# Patient Record
Sex: Female | Born: 1990 | Race: Black or African American | Hispanic: No | Marital: Married | Smoking: Current every day smoker
Health system: Southern US, Community
[De-identification: ages and names within clinical notes are randomized; demographics above are authoritative.]

## PROBLEM LIST (undated history)

## (undated) ENCOUNTER — Inpatient Hospital Stay (HOSPITAL_COMMUNITY): Payer: Self-pay

## (undated) DIAGNOSIS — Z202 Contact with and (suspected) exposure to infections with a predominantly sexual mode of transmission: Secondary | ICD-10-CM

## (undated) DIAGNOSIS — Z789 Other specified health status: Secondary | ICD-10-CM

## (undated) DIAGNOSIS — D1803 Hemangioma of intra-abdominal structures: Secondary | ICD-10-CM

## (undated) HISTORY — DX: Morbid (severe) obesity due to excess calories: E66.01

## (undated) HISTORY — PX: NO PAST SURGERIES: SHX2092

---
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
## 2009-02-11 ENCOUNTER — Emergency Department (HOSPITAL_COMMUNITY): Admission: EM | Admit: 2009-02-11 | Discharge: 2009-02-11 | Payer: Self-pay | Admitting: Emergency Medicine

## 2010-02-20 ENCOUNTER — Encounter: Payer: Self-pay | Admitting: *Deleted

## 2011-12-22 ENCOUNTER — Encounter (HOSPITAL_COMMUNITY): Payer: Self-pay | Admitting: Emergency Medicine

## 2011-12-22 ENCOUNTER — Other Ambulatory Visit (HOSPITAL_COMMUNITY)
Admission: RE | Admit: 2011-12-22 | Discharge: 2011-12-22 | Disposition: A | Discharge status: Home / Self Care | Payer: Self-pay | Source: Ambulatory Visit | Attending: Emergency Medicine | Admitting: Emergency Medicine

## 2011-12-22 ENCOUNTER — Emergency Department (INDEPENDENT_AMBULATORY_CARE_PROVIDER_SITE_OTHER)
Admission: EM | Admit: 2011-12-22 | Discharge: 2011-12-22 | Disposition: A | Discharge status: Home / Self Care | Payer: Self-pay | Source: Home / Self Care | Attending: Emergency Medicine | Admitting: Emergency Medicine

## 2011-12-22 DIAGNOSIS — N760 Acute vaginitis: Secondary | ICD-10-CM | POA: Insufficient documentation

## 2011-12-22 DIAGNOSIS — M549 Dorsalgia, unspecified: Secondary | ICD-10-CM

## 2011-12-22 DIAGNOSIS — Z113 Encounter for screening for infections with a predominantly sexual mode of transmission: Secondary | ICD-10-CM | POA: Insufficient documentation

## 2011-12-22 DIAGNOSIS — N730 Acute parametritis and pelvic cellulitis: Secondary | ICD-10-CM

## 2011-12-22 DIAGNOSIS — N898 Other specified noninflammatory disorders of vagina: Secondary | ICD-10-CM

## 2011-12-22 LAB — POCT URINALYSIS DIP (DEVICE)
Ketones, ur: NEGATIVE mg/dL
Protein, ur: 30 mg/dL — AB
Specific Gravity, Urine: 1.02 (ref 1.005–1.030)
Urobilinogen, UA: 0.2 mg/dL (ref 0.0–1.0)
pH: 6.5 (ref 5.0–8.0)

## 2011-12-22 NOTE — ED Provider Notes (Addendum)
History     CSN: [id number]  Arrival date & time 12/22/11  1443   First MD Initiated Contact with Patient 12/22/11 1701      Chief Complaint  Patient presents with  . Abdominal Pain    (Consider location/radiation/quality/duration/timing/severity/associated sxs/prior treatment) Patient is a 21 y.o. female presenting with abdominal pain. The history is provided by the patient.  Abdominal Pain The primary symptoms of the illness include abdominal pain. The current episode started 2 days ago. The onset of the illness was gradual. The problem has not changed since onset. The abdominal pain is generalized. The abdominal pain radiates to the left flank and back. The severity of the abdominal pain is 8/10. Relieved by: ibuprofen.  The patient states that she believes she is currently not pregnant. The patient has not had a change in bowel habit. Additional symptoms associated with the illness include back pain. Symptoms associated with the illness do not include chills, anorexia, diaphoresis, heartburn, constipation, urgency, hematuria or frequency.  No LMP recorded. Patient has had an injection.   History reviewed. No pertinent past medical history.  History reviewed. No pertinent past surgical history.  No family history on file.  History  Substance Use Topics  . Smoking status: Never Smoker   . Smokeless tobacco: Not on file  . Alcohol Use: No    OB History    Grav Para Term Preterm Abortions TAB SAB Ect Mult Living                  Review of Systems  Constitutional: Negative for chills and diaphoresis.  Gastrointestinal: Positive for abdominal pain. Negative for heartburn, constipation and anorexia.  Genitourinary: Negative for urgency, frequency and hematuria.  Musculoskeletal: Positive for back pain.  All other systems reviewed and are negative.    Allergies  Peanuts  Home Medications   Current Outpatient Rx  Name  Route  Sig  Dispense  Refill  . IBUPROFEN  200 MG PO TABS   Oral   Take 200 mg by mouth every 6 (six) hours as needed.         Marland Kitchen DEPO-PROVERA IM   Intramuscular   Inject into the muscle. 3 weeks late having injection         . DOXYCYCLINE HYCLATE 100 MG PO CAPS   Oral   Take 1 capsule (100 mg total) by mouth 2 (two) times daily.   14 capsule   0   . METRONIDAZOLE 500 MG PO TABS   Oral   Take 1 tablet (500 mg total) by mouth 2 (two) times daily.   14 tablet   0     BP 109/77  Pulse 91  Temp 98.7 F (37.1 C) (Oral)  Resp 16  SpO2 95%  Physical Exam  Nursing note and vitals reviewed. Constitutional: She is oriented to person, place, and time. Vital signs are normal. She appears well-developed and well-nourished. She is active and cooperative.  HENT:  Head: Normocephalic.  Eyes: Conjunctivae normal are normal. Pupils are equal, round, and reactive to light. No scleral icterus.  Neck: Trachea normal and normal range of motion. Neck supple.  Cardiovascular: Normal rate, regular rhythm, normal heart sounds and intact distal pulses.   Pulmonary/Chest: Effort normal and breath sounds normal.  Abdominal: Soft. Bowel sounds are normal. There is tenderness in the suprapubic area. There is CVA tenderness.  Genitourinary: Uterus normal. Pelvic exam was performed with patient supine. There is no rash, tenderness, lesion or injury on the right  labia. There is no rash, tenderness, lesion or injury on the left labia. Cervix exhibits motion tenderness and discharge. Cervix exhibits no friability. Right adnexum displays no mass, no tenderness and no fullness. Left adnexum displays no mass, no tenderness and no fullness. No erythema, tenderness or bleeding around the vagina. No foreign body around the vagina. No signs of injury around the vagina. Vaginal discharge found.       Small amount white discharge  Lymphadenopathy:    She has no cervical adenopathy.       Right: No inguinal adenopathy present.       Left: No inguinal  adenopathy present.  Neurological: She is alert and oriented to person, place, and time. No cranial nerve deficit or sensory deficit.  Skin: Skin is warm and dry. No rash noted.  Psychiatric: She has a normal mood and affect. Her speech is normal and behavior is normal. Judgment and thought content normal. Cognition and memory are normal.    ED Course  Procedures (including critical care time)  Labs Reviewed  POCT URINALYSIS DIP (DEVICE) - Abnormal; Notable for the following:    Protein, ur 30 (*)     All other components within normal limits  POCT PREGNANCY, URINE  CERVICOVAGINAL ANCILLARY ONLY  LAB REPORT - SCANNED   No results found.   1. PID (acute pelvic inflammatory disease)   2. Back pain   3. Vaginal discharge       MDM  Will treat empirically for cervicitis.  Condoms for STD prevention.  Follow up with gynecology this week if symptoms are not improved, ultrasound may be warranted.      12/25/11  1428 Positive for CT, spoke with patient, sent new RX for longer course of antibiotics, instructed to rtc for Rocephin administration.  Johnsie Kindred, NP 12/25/11 1660  Johnsie Kindred, NP 12/25/11 1429

## 2011-12-22 NOTE — ED Notes (Signed)
Reports generalized abdominal pain, low back pain, pain onset 2 days ago. Reports a vaginal discharge that is white.  Denies pain with urination

## 2011-12-22 NOTE — ED Notes (Signed)
Equipment at bedside.  Instructed patient to put on gown for exam

## 2011-12-25 ENCOUNTER — Telehealth (HOSPITAL_COMMUNITY): Payer: Self-pay | Admitting: General Practice

## 2011-12-25 MED ORDER — DOXYCYCLINE HYCLATE 100 MG PO CAPS: 100.0000 mg | ORAL_CAPSULE | Freq: Two times a day (BID) | ORAL | Status: DC

## 2011-12-25 MED ORDER — CEFTRIAXONE SODIUM 250 MG IJ SOLR: 250.0000 mg | Freq: Once | INTRAMUSCULAR | Status: DC

## 2011-12-25 MED ORDER — METRONIDAZOLE 500 MG PO TABS: 500.0000 mg | ORAL_TABLET | Freq: Two times a day (BID) | ORAL | Status: DC

## 2011-12-25 NOTE — ED Provider Notes (Signed)
Medical screening examination/treatment/procedure(s) were performed by non-physician practitioner and as supervising physician I was immediately available for consultation/collaboration.  Raynald Blend, MD 12/25/11 1455

## 2011-12-25 NOTE — ED Notes (Signed)
Faxed two scripts to [facility] on [address] per Lannie Fields, NP.  Carmen changed qty. From 14 to 28.  Pharmacist is aware

## 2011-12-25 NOTE — ED Provider Notes (Signed)
Medical screening examination/treatment/procedure(s) were performed by non-physician practitioner and as supervising physician I was immediately available for consultation/collaboration.  Raynald Blend, MD 12/25/11 1158

## 2011-12-27 ENCOUNTER — Telehealth (HOSPITAL_COMMUNITY): Payer: Self-pay | Admitting: *Deleted

## 2011-12-29 NOTE — ED Notes (Signed)
Telephone call with message by e Faelyn Sigler at 1230 Pt returned call at 1240 - identified by two identifiers, given test results, educated on safe sex practices, and meds. Instructed to return to [facility] for rocephin as prescribed ASAP

## 2012-01-03 ENCOUNTER — Telehealth (HOSPITAL_COMMUNITY): Payer: Self-pay | Admitting: *Deleted

## 2012-01-03 NOTE — ED Notes (Signed)
Pt. was notified on 12/29/11 to return to [facility] for treatment. Pt. has not come back. I called and left a message to call. Vassie Moselle 01/03/2012

## 2012-01-04 ENCOUNTER — Telehealth (HOSPITAL_COMMUNITY): Payer: Self-pay | Admitting: *Deleted

## 2012-01-04 NOTE — ED Notes (Addendum)
Pt. did not come back for Rocephin injection after multiple phone calls.  Letter sent asking her to come back for further treatment.  DHHS form faxed to [facility].  with note that pt. did not come for the Rocephin and Rx. Doxycycline was faxed to the [facility] on [facility]. Unable to verify if pt. picked up Rx. Vassie Moselle 01/04/2012

## 2012-05-27 NOTE — Telephone Encounter (Signed)
Error

## 2013-08-24 ENCOUNTER — Encounter (HOSPITAL_COMMUNITY): Payer: Self-pay | Admitting: Emergency Medicine

## 2013-08-24 ENCOUNTER — Emergency Department (HOSPITAL_COMMUNITY)
Admission: EM | Admit: 2013-08-24 | Discharge: 2013-08-24 | Disposition: A | Discharge status: Home / Self Care | Payer: Self-pay | Attending: Emergency Medicine | Admitting: Emergency Medicine

## 2013-08-24 DIAGNOSIS — R319 Hematuria, unspecified: Secondary | ICD-10-CM | POA: Insufficient documentation

## 2013-08-24 DIAGNOSIS — N390 Urinary tract infection, site not specified: Secondary | ICD-10-CM | POA: Insufficient documentation

## 2013-08-24 DIAGNOSIS — Z79899 Other long term (current) drug therapy: Secondary | ICD-10-CM | POA: Insufficient documentation

## 2013-08-24 DIAGNOSIS — Z792 Long term (current) use of antibiotics: Secondary | ICD-10-CM | POA: Insufficient documentation

## 2013-08-24 DIAGNOSIS — Z3202 Encounter for pregnancy test, result negative: Secondary | ICD-10-CM | POA: Insufficient documentation

## 2013-08-24 LAB — URINALYSIS, ROUTINE W REFLEX MICROSCOPIC
Glucose, UA: 100 mg/dL — AB
KETONES UR: 40 mg/dL — AB
NITRITE: POSITIVE — AB
Specific Gravity, Urine: 1.021 (ref 1.005–1.030)
UROBILINOGEN UA: 1 mg/dL (ref 0.0–1.0)
pH: 5 (ref 5.0–8.0)

## 2013-08-24 LAB — URINE MICROSCOPIC-ADD ON

## 2013-08-24 LAB — POC URINE PREG, ED: PREG TEST UR: NEGATIVE

## 2013-08-24 MED ORDER — CEPHALEXIN 250 MG PO CAPS
250.0000 mg | ORAL_CAPSULE | Freq: Once | ORAL | Status: AC
  Administered 2013-08-24: 250 mg via ORAL
  Filled 2013-08-24: qty 1

## 2013-08-24 MED ORDER — CEPHALEXIN 500 MG PO CAPS: 500.0000 mg | ORAL_CAPSULE | Freq: Four times a day (QID) | ORAL | Status: DC

## 2013-08-24 NOTE — Discharge Instructions (Signed)

## 2013-08-24 NOTE — ED Provider Notes (Signed)
CSN: [id number]     Arrival date & time 08/24/13  1700 History   First MD Initiated Contact with Patient 08/24/13 1819     Chief Complaint  Patient presents with  . Hematuria     (Consider location/radiation/quality/duration/timing/severity/associated sxs/prior Treatment) HPI Comments: Patient presents emergency department with chief complaint of hematuria. She states the symptoms started last night. She denies any fevers, chills, nausea, vomiting, abdominal pain, dysuria, urgency, or frequency. She has not tried taking anything to alleviate her symptoms. There are no aggravating or alleviating factors. Patient has followup with primary care in the community. No other health problems.  The history is provided by the patient. No language interpreter was used.    History reviewed. No pertinent past medical history. History reviewed. No pertinent past surgical history. No family history on file. History  Substance Use Topics  . Smoking status: Never Smoker   . Smokeless tobacco: Not on file  . Alcohol Use: Yes     Comment: occ   OB History   Grav Para Term Preterm Abortions TAB SAB Ect Mult Living                 Review of Systems  Genitourinary: Positive for hematuria. Negative for dysuria, frequency and flank pain.  All other systems reviewed and are negative.     Allergies  Peanuts  Home Medications   Prior to Admission medications   Medication Sig Start Date End Date Taking? Authorizing Provider  cefTRIAXone (ROCEPHIN) 250 MG injection Inject 250 mg into the muscle once.  FOR IM use in LARGE MUSCLE MASS 12/25/11   Awilda Metro, NP  doxycycline (VIBRAMYCIN) 100 MG capsule Take 1 capsule (100 mg total) by mouth 2 (two) times daily. 12/25/11   Awilda Metro, NP  ibuprofen (ADVIL,MOTRIN) 200 MG tablet Take 200 mg by mouth every 6 (six) hours as needed.    Historical Provider, MD  MedroxyPROGESTERone Acetate (DEPO-PROVERA IM) Inject into the muscle. 3 weeks late  having injection    Historical Provider, MD  metroNIDAZOLE (FLAGYL) 500 MG tablet Take 1 tablet (500 mg total) by mouth 2 (two) times daily. 12/25/11   Awilda Metro, NP   BP 112/85  Pulse 95  Temp(Src) 98.4 F (36.9 C) (Oral)  Resp 18  SpO2 97%  LMP 08/05/2013 Physical Exam  Nursing note and vitals reviewed. Constitutional: She is oriented to person, place, and time. She appears well-developed and well-nourished.  HENT:  Head: Normocephalic and atraumatic.  Eyes: Conjunctivae and EOM are normal. Pupils are equal, round, and reactive to light.  Neck: Normal range of motion. Neck supple.  Cardiovascular: Normal rate and regular rhythm.  Exam reveals no gallop and no friction rub.   No murmur heard. Pulmonary/Chest: Effort normal and breath sounds normal. No respiratory distress. She has no wheezes. She has no rales. She exhibits no tenderness.  Abdominal: Soft. Bowel sounds are normal. She exhibits no distension and no mass. There is no tenderness. There is no rebound and no guarding.  No focal abdominal tenderness, no RLQ tenderness or pain at McBurney's point, no RUQ tenderness or Murphy's sign, no left-sided abdominal tenderness, no fluid wave, or signs of peritonitis   Musculoskeletal: Normal range of motion. She exhibits no edema and no tenderness.  Neurological: She is alert and oriented to person, place, and time.  Skin: Skin is warm and dry.  Psychiatric: She has a normal mood and affect. Her behavior is normal. Judgment and thought content normal.  ED Course  Procedures (including critical care time) Results for orders placed during the hospital encounter of 08/24/13  URINALYSIS, ROUTINE W REFLEX MICROSCOPIC      Result Value Ref Range   Color, Urine RED (*) YELLOW   APPearance TURBID (*) CLEAR   Specific Gravity, Urine 1.021  1.005 - 1.030   pH 5.0  5.0 - 8.0   Glucose, UA 100 (*) NEGATIVE mg/dL   Hgb urine dipstick LARGE (*) NEGATIVE   Bilirubin Urine LARGE (*)  NEGATIVE   Ketones, ur 40 (*) NEGATIVE mg/dL   Protein, ur >300 (*) NEGATIVE mg/dL   Urobilinogen, UA 1.0  0.0 - 1.0 mg/dL   Nitrite POSITIVE (*) NEGATIVE   Leukocytes, UA LARGE (*) NEGATIVE  URINE MICROSCOPIC-ADD ON      Result Value Ref Range   Squamous Epithelial / LPF RARE  RARE   WBC, UA TOO NUMEROUS TO COUNT  <3 WBC/hpf   RBC / HPF TOO NUMEROUS TO COUNT  <3 RBC/hpf   Bacteria, UA MANY (*) RARE  POC URINE PREG, ED      Result Value Ref Range   Preg Test, Ur NEGATIVE  NEGATIVE   No results found.   Imaging Review No results found.   EKG Interpretation None      MDM   Final diagnoses:  UTI (lower urinary tract infection)    Patient with hematuria. UA is remarkable for UTI. She is not pregnant. Will treat with Keflex. Since our last night. Recommend followup with PCP in 3-4 days if symptoms do not improve. Patient understands and agrees with plan. She is stable and ready for discharge. She is afebrile, tolerating oral intake, and not in any distress.    Montine Circle, PA-C 08/24/13 [phone]

## 2013-08-24 NOTE — ED Notes (Signed)
Pt is here with complains of blood in urine since last nite.  Denies abdominal pain, back pain, weight loss, or thirst.  Pt states LMP:  08/05/13.  No blood thinners

## 2013-08-25 ENCOUNTER — Encounter (HOSPITAL_COMMUNITY): Payer: Self-pay | Admitting: Emergency Medicine

## 2013-08-25 ENCOUNTER — Emergency Department (HOSPITAL_COMMUNITY)
Admission: EM | Admit: 2013-08-25 | Discharge: 2013-08-26 | Disposition: A | Discharge status: Home / Self Care | Payer: Self-pay | Attending: Emergency Medicine | Admitting: Emergency Medicine

## 2013-08-25 DIAGNOSIS — R301 Vesical tenesmus: Secondary | ICD-10-CM

## 2013-08-25 DIAGNOSIS — Z79899 Other long term (current) drug therapy: Secondary | ICD-10-CM | POA: Insufficient documentation

## 2013-08-25 DIAGNOSIS — R109 Unspecified abdominal pain: Secondary | ICD-10-CM | POA: Insufficient documentation

## 2013-08-25 DIAGNOSIS — Z792 Long term (current) use of antibiotics: Secondary | ICD-10-CM | POA: Insufficient documentation

## 2013-08-25 DIAGNOSIS — R110 Nausea: Secondary | ICD-10-CM | POA: Insufficient documentation

## 2013-08-25 DIAGNOSIS — N3090 Cystitis, unspecified without hematuria: Secondary | ICD-10-CM | POA: Insufficient documentation

## 2013-08-25 DIAGNOSIS — Z3202 Encounter for pregnancy test, result negative: Secondary | ICD-10-CM | POA: Insufficient documentation

## 2013-08-25 DIAGNOSIS — N3091 Cystitis, unspecified with hematuria: Secondary | ICD-10-CM

## 2013-08-25 LAB — CBC
HCT: 41 % (ref 36.0–46.0)
HEMOGLOBIN: 13.8 g/dL (ref 12.0–15.0)
MCH: 29.7 pg (ref 26.0–34.0)
MCHC: 33.7 g/dL (ref 30.0–36.0)
MCV: 88.4 fL (ref 78.0–100.0)
Platelets: 312 10*3/uL (ref 150–400)
RBC: 4.64 MIL/uL (ref 3.87–5.11)
RDW: 12.5 % (ref 11.5–15.5)
WBC: 19.7 10*3/uL — ABNORMAL HIGH (ref 4.0–10.5)

## 2013-08-25 MED ORDER — SODIUM CHLORIDE 0.9 % IV SOLN
Freq: Once | INTRAVENOUS | Status: AC
  Administered 2013-08-25: via INTRAVENOUS

## 2013-08-25 MED ORDER — DEXTROSE 5 % IV SOLN
1.0000 g | Freq: Once | INTRAVENOUS | Status: AC
  Administered 2013-08-25: 1 g via INTRAVENOUS
  Filled 2013-08-25: qty 10

## 2013-08-25 MED ORDER — MORPHINE SULFATE 4 MG/ML IJ SOLN
4.0000 mg | Freq: Once | INTRAMUSCULAR | Status: AC
Start: 2013-08-25 — End: 2013-08-25
  Administered 2013-08-25: 4 mg via INTRAVENOUS
  Filled 2013-08-25: qty 1

## 2013-08-25 MED ORDER — ONDANSETRON HCL 4 MG/2ML IJ SOLN
4.0000 mg | Freq: Once | INTRAMUSCULAR | Status: AC
  Administered 2013-08-25: 4 mg via INTRAVENOUS
  Filled 2013-08-25: qty 2

## 2013-08-25 NOTE — ED Notes (Signed)
The patient was here yesterday for the same thing and she was discharged with Cephalexin.  Today, she said she is now haivng extreme pain and nausea.  Yesterday the only symptoms she had was bloody urine and today she has severe pain, blood in urine and she is nauseous.

## 2013-08-25 NOTE — ED Provider Notes (Signed)
Medical screening examination/treatment/procedure(s) were performed by non-physician practitioner and as supervising physician I was immediately available for consultation/collaboration.   EKG Interpretation None        William Tylyn Derwin, MD 08/25/13 0032 

## 2013-08-25 NOTE — ED Provider Notes (Signed)
CSN: [id number]     Arrival date & time 08/25/13  2301 History   First MD Initiated Contact with Patient 08/25/13 2320     Chief Complaint  Patient presents with  . Abdominal Pain    The patient was here yesterday for the same thing and she was discharged with Cephalexin.  Today, she said she is now haivng extreme pain and nausea.       (Consider location/radiation/quality/duration/timing/severity/associated sxs/prior Treatment) HPI Comments: Patient is a 23 year old female who presents to the emergency department for suprapubic abdominal pain. Patient states the pain started this morning and has been fairly constant with associated nausea. Patient states that symptoms were preceded by hematuria. Patient was evaluated in the emergency department yesterday for this complaint and diagnosed with a urinary tract infection. Patient has taken 4 doses of her Keflex since discharge yesterday. She states she has taken ibuprofen for her pain today without relief. She does endorse some nausea. Patient denies associated fever, chest pain, shortness of breath, vomiting, diarrhea, melena or hematochezia, vaginal bleeding, vaginal discharge, and syncope.  Patient is a 23 y.o. female presenting with abdominal pain. The history is provided by the patient. No language interpreter was used.  Abdominal Pain Associated symptoms: hematuria and nausea   Associated symptoms: no vaginal bleeding and no vaginal discharge     History reviewed. No pertinent past medical history. History reviewed. No pertinent past surgical history. History reviewed. No pertinent family history. History  Substance Use Topics  . Smoking status: Never Smoker   . Smokeless tobacco: Not on file  . Alcohol Use: Yes     Comment: occ   OB History   Grav Para Term Preterm Abortions TAB SAB Ect Mult Living                  Review of Systems  Gastrointestinal: Positive for nausea and abdominal pain.  Genitourinary: Positive for  hematuria. Negative for flank pain, vaginal bleeding and vaginal discharge.  Musculoskeletal: Negative for back pain.  All other systems reviewed and are negative.    Allergies  Peanuts  Home Medications   Prior to Admission medications   Medication Sig Start Date End Date Taking? Authorizing Provider  cephALEXin (KEFLEX) 500 MG capsule Take 500 mg by mouth 4 (four) times daily. 08/24/13 08/31/13 Yes Historical Provider, MD  ibuprofen (ADVIL,MOTRIN) 200 MG tablet Take 400 mg by mouth every 6 (six) hours as needed for moderate pain.    Yes Historical Provider, MD  HYDROcodone-acetaminophen (NORCO/VICODIN) 5-325 MG per tablet Take 1 tablet by mouth every 6 (six) hours as needed for moderate pain or severe pain. 08/26/13   Antonietta Breach, PA-C  phenazopyridine (PYRIDIUM) 200 MG tablet Take 1 tablet (200 mg total) by mouth 3 (three) times daily. 08/26/13   Antonietta Breach, PA-C   BP 112/66  Pulse 74  Temp(Src) 98 F (36.7 C) (Oral)  Resp 18  SpO2 99%  LMP 08/05/2013  Physical Exam  Nursing note and vitals reviewed. Constitutional: She is oriented to person, place, and time. She appears well-developed and well-nourished. No distress.  Nontoxic/nonseptic appearing  HENT:  Head: Normocephalic and atraumatic.  Eyes: Conjunctivae and EOM are normal. No scleral icterus.  Neck: Normal range of motion.  Cardiovascular: Normal rate, regular rhythm and normal heart sounds.   Pulmonary/Chest: Effort normal and breath sounds normal. No respiratory distress. She has no wheezes. She has no rales.  Chest expansion symmetric  Abdominal: Soft. She exhibits no distension. There is tenderness. There  is no rebound and no guarding.  Suprapubic TTP. Abdomen soft. No peritoneal signs. No CVA tenderness bilaterally.  Musculoskeletal: Normal range of motion.  Neurological: She is alert and oriented to person, place, and time. She exhibits normal muscle tone. Coordination normal.  GCS 15. Patient moves extremities  without ataxia.  Skin: Skin is warm and dry. No rash noted. She is not diaphoretic. No erythema. No pallor.  Psychiatric: She has a normal mood and affect. Her behavior is normal.    ED Course  Procedures (including critical care time) Labs Review Labs Reviewed  CBC - Abnormal; Notable for the following:    WBC 19.7 (*)    All other components within normal limits  COMPREHENSIVE METABOLIC PANEL - Abnormal; Notable for the following:    Potassium 3.6 (*)    All other components within normal limits  URINALYSIS, ROUTINE W REFLEX MICROSCOPIC - Abnormal; Notable for the following:    Color, Urine RED (*)    APPearance TURBID (*)    Hgb urine dipstick LARGE (*)    Bilirubin Urine LARGE (*)    Ketones, ur 40 (*)    Protein, ur >300 (*)    Nitrite POSITIVE (*)    Leukocytes, UA LARGE (*)    All other components within normal limits  URINE MICROSCOPIC-ADD ON - Abnormal; Notable for the following:    Squamous Epithelial / LPF FEW (*)    Bacteria, UA FEW (*)    All other components within normal limits  POC URINE PREG, ED  I-STAT CG4 LACTIC ACID, ED    Imaging Review No results found.   EKG Interpretation None      MDM   Final diagnoses:  Hemorrhagic cystitis  Painful bladder spasm    Patient is a 24 year old female who presents to the emergency department for suprapubic discomfort and hematuria. Patient was diagnosed with a urinary tract infection yesterday. She states that her pain developed this morning. Patient denies any vaginal pain, vaginal discharge, or vaginal bleeding. Patient adamant that blood is not from her vagina stating, "I even stuck a tampon up there to check and it was clean".   Patient on physical exam today has tenderness to palpation in her suprapubic region. Urinalysis again suggests infection. Leukocytosis consistent with UTI. Patient has no CVA tenderness or fever today. Patient tx in ED with rocephin and morphine with significant impovement in her  symptoms. She has been able to tolerate fluids in the ED without emesis or worsening abdominal pain. Reexamination moderately improved. Suspect the pain is secondary to bladder spasms from hemorrhagic cystitis.   Patient stable and appropriate for d/c with Rx for Norco and pyridium. Have advised she continue the Keflex as prescribed to her for UTI. Patient informed that urine cultures are pending. PCP f/u advised and return precautions provided. Patient agreeable to plan with no unaddressed concerns.   Filed Vitals:   08/25/13 2307 08/25/13 2348 08/26/13 0220 08/26/13 0249  BP: 113/73 120/81 108/72 112/66  Pulse: 74     Temp: 98.2 F (36.8 C)  98 F (36.7 C)   TempSrc: Oral  Oral   Resp: 16 18 18 18   SpO2: 100% 99% 100% 99%       Antonietta Breach, PA-C 08/26/13 0451

## 2013-08-25 NOTE — ED Notes (Signed)
Kelly, PA at bedside. 

## 2013-08-26 LAB — URINALYSIS, ROUTINE W REFLEX MICROSCOPIC
Glucose, UA: NEGATIVE mg/dL
KETONES UR: 40 mg/dL — AB
Nitrite: POSITIVE — AB
Specific Gravity, Urine: 1.014 (ref 1.005–1.030)
Urobilinogen, UA: 1 mg/dL (ref 0.0–1.0)
pH: 6 (ref 5.0–8.0)

## 2013-08-26 LAB — COMPREHENSIVE METABOLIC PANEL
ALT: 17 U/L (ref 0–35)
ANION GAP: 14 (ref 5–15)
AST: 19 U/L (ref 0–37)
Albumin: 4 g/dL (ref 3.5–5.2)
Alkaline Phosphatase: 52 U/L (ref 39–117)
BUN: 6 mg/dL (ref 6–23)
CO2: 20 meq/L (ref 19–32)
Calcium: 9 mg/dL (ref 8.4–10.5)
Chloride: 107 mEq/L (ref 96–112)
Creatinine, Ser: 0.73 mg/dL (ref 0.50–1.10)
GFR calc Af Amer: >90 mL/min (ref >90)
Glucose, Bld: 84 mg/dL (ref 70–99)
POTASSIUM: 3.6 meq/L — AB (ref 3.7–5.3)
SODIUM: 141 meq/L (ref 137–147)
Total Bilirubin: 0.7 mg/dL (ref 0.3–1.2)
Total Protein: 7.8 g/dL (ref 6.0–8.3)

## 2013-08-26 LAB — POC URINE PREG, ED: PREG TEST UR: NEGATIVE

## 2013-08-26 LAB — URINE CULTURE

## 2013-08-26 LAB — URINE MICROSCOPIC-ADD ON

## 2013-08-26 LAB — I-STAT CG4 LACTIC ACID, ED: Lactic Acid, Venous: 0.7 mmol/L (ref 0.5–2.2)

## 2013-08-26 MED ORDER — HYDROCODONE-ACETAMINOPHEN 5-325 MG PO TABS: 1.0000 | ORAL_TABLET | Freq: Four times a day (QID) | ORAL | Status: DC | PRN

## 2013-08-26 MED ORDER — HYDROCODONE-ACETAMINOPHEN 5-325 MG PO TABS
2.0000 | ORAL_TABLET | Freq: Once | ORAL | Status: AC
  Administered 2013-08-26: 2 via ORAL
  Filled 2013-08-26: qty 2

## 2013-08-26 MED ORDER — PHENAZOPYRIDINE HCL 100 MG PO TABS
95.0000 mg | ORAL_TABLET | Freq: Once | ORAL | Status: AC
  Administered 2013-08-26: 100 mg via ORAL
  Filled 2013-08-26: qty 1

## 2013-08-26 MED ORDER — PHENAZOPYRIDINE HCL 200 MG PO TABS: 200.0000 mg | ORAL_TABLET | Freq: Three times a day (TID) | ORAL | Status: DC

## 2013-08-26 NOTE — ED Notes (Signed)
Awaiting pt ride home.

## 2013-08-26 NOTE — Discharge Instructions (Signed)

## 2013-08-26 NOTE — ED Provider Notes (Signed)
Medical screening examination/treatment/procedure(s) were performed by non-physician practitioner and as supervising physician I was immediately available for consultation/collaboration.   EKG Interpretation None       Kalman Drape, MD 08/26/13 [phone]

## 2013-08-26 NOTE — ED Notes (Signed)
Pt given Sprite and crackers

## 2013-08-29 ENCOUNTER — Telehealth (HOSPITAL_BASED_OUTPATIENT_CLINIC_OR_DEPARTMENT_OTHER): Payer: Self-pay | Admitting: Emergency Medicine

## 2013-08-29 NOTE — Telephone Encounter (Signed)
Post ED Visit - Positive Culture Follow-up  Culture report reviewed by antimicrobial stewardship pharmacist: []  Wes [place], Pharm.D., BCPS [x]  Heide Guile, Pharm.D., BCPS []  Alycia Rossetti, Pharm.D., BCPS []  [place], Pharm.D., BCPS, AAHIVP []  Legrand [place]o, Pharm.D., BCPS, AAHIVP  Positive urine culture Treated with Keflex, organism sensitive to the same and no further patient follow-up is required at this time.  Verdel, Rex Kras 08/29/2013, 11:09 AM

## 2014-07-24 ENCOUNTER — Encounter (HOSPITAL_COMMUNITY): Payer: Self-pay | Admitting: *Deleted

## 2014-07-24 ENCOUNTER — Emergency Department (HOSPITAL_COMMUNITY)
Admission: EM | Admit: 2014-07-24 | Discharge: 2014-07-24 | Disposition: A | Discharge status: Home / Self Care | Payer: Self-pay | Attending: Emergency Medicine | Admitting: Emergency Medicine

## 2014-07-24 ENCOUNTER — Emergency Department (HOSPITAL_COMMUNITY): Payer: Self-pay

## 2014-07-24 DIAGNOSIS — Z3202 Encounter for pregnancy test, result negative: Secondary | ICD-10-CM | POA: Insufficient documentation

## 2014-07-24 DIAGNOSIS — R51 Headache: Secondary | ICD-10-CM | POA: Insufficient documentation

## 2014-07-24 DIAGNOSIS — R519 Headache, unspecified: Secondary | ICD-10-CM

## 2014-07-24 DIAGNOSIS — Z720 Tobacco use: Secondary | ICD-10-CM | POA: Insufficient documentation

## 2014-07-24 DIAGNOSIS — N643 Galactorrhea not associated with childbirth: Secondary | ICD-10-CM | POA: Insufficient documentation

## 2014-07-24 LAB — CBC WITH DIFFERENTIAL/PLATELET
BASOS PCT: 0 % (ref 0–1)
Basophils Absolute: 0 10*3/uL (ref 0.0–0.1)
EOS ABS: 0 10*3/uL (ref 0.0–0.7)
Eosinophils Relative: 0 % (ref 0–5)
HEMATOCRIT: 42.1 % (ref 36.0–46.0)
Hemoglobin: 14.1 g/dL (ref 12.0–15.0)
Lymphocytes Relative: 24 % (ref 12–46)
Lymphs Abs: 3.5 10*3/uL (ref 0.7–4.0)
MCH: 29.9 pg (ref 26.0–34.0)
MCHC: 33.5 g/dL (ref 30.0–36.0)
MCV: 89.4 fL (ref 78.0–100.0)
MONO ABS: 1.1 10*3/uL — AB (ref 0.1–1.0)
MONOS PCT: 8 % (ref 3–12)
Neutro Abs: 9.9 10*3/uL — ABNORMAL HIGH (ref 1.7–7.7)
Neutrophils Relative %: 68 % (ref 43–77)
Platelets: 289 10*3/uL (ref 150–400)
RBC: 4.71 MIL/uL (ref 3.87–5.11)
RDW: 13.1 % (ref 11.5–15.5)
WBC: 14.6 10*3/uL — AB (ref 4.0–10.5)

## 2014-07-24 LAB — URINALYSIS, ROUTINE W REFLEX MICROSCOPIC
BILIRUBIN URINE: NEGATIVE
Glucose, UA: NEGATIVE mg/dL
HGB URINE DIPSTICK: NEGATIVE
KETONES UR: NEGATIVE mg/dL
Nitrite: POSITIVE — AB
PROTEIN: NEGATIVE mg/dL
Specific Gravity, Urine: 1.028 (ref 1.005–1.030)
Urobilinogen, UA: 1 mg/dL (ref 0.0–1.0)
pH: 7 (ref 5.0–8.0)

## 2014-07-24 LAB — COMPREHENSIVE METABOLIC PANEL
ALBUMIN: 4 g/dL (ref 3.5–5.0)
ALT: 19 U/L (ref 14–54)
ANION GAP: 7 (ref 5–15)
AST: 21 U/L (ref 15–41)
Alkaline Phosphatase: 54 U/L (ref 38–126)
BUN: 5 mg/dL — AB (ref 6–20)
CO2: 23 mmol/L (ref 22–32)
CREATININE: 0.62 mg/dL (ref 0.44–1.00)
Calcium: 9.1 mg/dL (ref 8.9–10.3)
Chloride: 108 mmol/L (ref 101–111)
GFR calc Af Amer: >60 mL/min (ref >60)
GFR calc non Af Amer: >60 mL/min (ref >60)
Glucose, Bld: 91 mg/dL (ref 65–99)
Potassium: 3.6 mmol/L (ref 3.5–5.1)
Sodium: 138 mmol/L (ref 135–145)
TOTAL PROTEIN: 7.5 g/dL (ref 6.5–8.1)
Total Bilirubin: 1.3 mg/dL — ABNORMAL HIGH (ref 0.3–1.2)

## 2014-07-24 LAB — LIPASE, BLOOD: LIPASE: 20 U/L — AB (ref 22–51)

## 2014-07-24 LAB — URINE MICROSCOPIC-ADD ON

## 2014-07-24 LAB — POC URINE PREG, ED: PREG TEST UR: NEGATIVE

## 2014-07-24 MED ORDER — SODIUM CHLORIDE 0.9 % IV BOLUS (SEPSIS)
1000.0000 mL | Freq: Once | INTRAVENOUS | Status: AC
  Administered 2014-07-24: 1000 mL via INTRAVENOUS

## 2014-07-24 MED ORDER — MAGNESIUM SULFATE 2 GM/50ML IV SOLN
2.0000 g | Freq: Once | INTRAVENOUS | Status: AC
  Administered 2014-07-24: 2 g via INTRAVENOUS
  Filled 2014-07-24: qty 50

## 2014-07-24 MED ORDER — METHYLPREDNISOLONE SODIUM SUCC 125 MG IJ SOLR
125.0000 mg | Freq: Once | INTRAMUSCULAR | Status: AC
  Administered 2014-07-24: 125 mg via INTRAVENOUS
  Filled 2014-07-24: qty 2

## 2014-07-24 MED ORDER — PROCHLORPERAZINE EDISYLATE 5 MG/ML IJ SOLN
10.0000 mg | Freq: Once | INTRAMUSCULAR | Status: AC
  Administered 2014-07-24: 10 mg via INTRAVENOUS
  Filled 2014-07-24: qty 2

## 2014-07-24 MED ORDER — DIPHENHYDRAMINE HCL 50 MG/ML IJ SOLN
25.0000 mg | Freq: Once | INTRAMUSCULAR | Status: AC
  Administered 2014-07-24: 25 mg via INTRAVENOUS
  Filled 2014-07-24: qty 1

## 2014-07-24 MED ORDER — BUTALBITAL-APAP-CAFFEINE 50-325-40 MG PO TABS: 1.0000 | ORAL_TABLET | Freq: Four times a day (QID) | ORAL | Status: DC | PRN

## 2014-07-24 NOTE — ED Notes (Signed)
Pt c/o intermittent headaches x 1 month. Pt sates that she usually takes aspirin with relief but no relief today. Pt also c/o nipple drainage x 2-3 weeks. Also c/o intermittent lower abd pain x 1 month or so.

## 2014-07-24 NOTE — ED Notes (Signed)
Patient transported to CT 

## 2014-07-24 NOTE — Discharge Instructions (Signed)
PLEASE MAKE AN APPOINTMENT WITH FAMILY PRACTICE AT  [phone] -> call first thing Monday to schedule and appointment.  Galactorrhea Galactorrhea is when there is a milky nipple discharge. It is different from normal milk in nursing mothers. It usually comes from both nipples. Galactorrhea is not a disease but may be a symptom of a problem. It may continue for years after weaning. Galactorrhea is caused by the hormone prolactin, which stimulates milk production. If the breast discharge looks like pus, is bloody or if there is a lump present in the affected breast, the discharge may be caused by other problems including:  A benign cyst.  Papilloma.  Breast cancer.  A breast infection.  A breast abscess. It can also be seen in men who have a low or absent female hormone (testosterone) level. Galactorrhea can be present in a newborn if the mother had high female hormone (estrogen) levels that crossed into the baby through the placenta. The baby usually has enlarged breasts, but in time, it all goes away on its own. CAUSES   Tumor of the pituitary gland in the brain.  Problems with the hypothalamus in the brain that stimulates the pituitary gland.  Low thyroid function (hypothyroid disease).  Chronic kidney failure.  Medications, antidepressants, tranquilizers and blood pressure medication.  Herbal medications (nettle, fennel, blessed thistle, anise and fenugreek seed).  Illegal drugs (marijuana and opiates).  Breast stimulation during sexual activity or too many and frequent self breast exams.  Birth control pills.  Surgery or trauma to the breast causing nerve damage.  Spinal cord injury. SYMPTOMS   White, yellow or green discharge from one or both breasts.  No menstrual period (amenorrhea) or infrequent menstrual periods (hypomenorrhea).  Hot flashes, lack of sexual desire or vaginal dryness.  Infertility in women and men.  Headaches and vision problems.  Decrease in  calcium in your bones (developing osteopenia or osteoporosis). DIAGNOSIS  Your caregiver may be able to know your problem by taking a detailed history and physical exam of you. Tests that may be done, include:  Blood tests to check for the prolactin hormone, your female and thyroid hormones and a pregnancy test.  A detailed eye exam.  Mammogram.  X-rays, CT scan or MRI of breasts or your brain looking for a tumor. TREATMENT   Stopping medications that may be causing the galactorrhea.  Treating low thyroid function with thyroid hormones.  Medical or surgical (if necessary) treatment of a pituitary gland tumor.  Medication to lower the prolactin hormone level when no cause can be found.  Surgery as a last resort to remove the breasts ducts if the discharge persists with treatment and is a problem.  Treatment may not be necessary if you are not bothered by the breast discharge. HOME CARE INSTRUCTIONS   Before seeing your caregiver, make a list of all your symptoms, medications, when the breast discharge started and questions you may have.  Avoid breast stimulation during sexual activity.  Perform breast self exam once a month.  Avoid clothes that rub on your nipples.  Use breasts pads to absorb the discharge.  Wear a support bra. SEEK MEDICAL CARE IF:   You have galactorrhea and you are trying to get pregnant.  You develop hot flashes, vaginal dryness or lack of sexual desire.  You stop having menstrual periods or they are irregular or far apart.  You have headaches.  You have vision problems. SEEK IMMEDIATE MEDICAL CARE IF:   Your breast discharge is bloody or  pus-like.  You have breast pain.  You feel a lump in your breast.  Your breast shows wrinkling or dimpling.  Your breast becomes red and swollen. Document Released: 02/24/2004 Document Revised: 04/10/2011 Document Reviewed: 01/07/2008 [facility] Patient Information 2015 [place], [place]. This information  is not intended to replace advice given to you by your health care provider. Make sure you discuss any questions you have with your health care provider.   Do not hesitate to return to the Emergency Department for any new, worsening or concerning symptoms.

## 2014-07-24 NOTE — ED Provider Notes (Signed)
CSN: [id number]     Arrival date & time 07/24/14  1541 History   This chart was scribed for Monico Blitz, PA-C working with Varney Biles, MD by Mercy Moore, ED Scribe. This patient was seen in room TR02C/TR02C and the patient's care was started at 7:02 PM.   Chief Complaint  Patient presents with  . Headache  . Abdominal Pain  . Breast Discharge    The history is provided by the patient. No language interpreter was used.    HPI Comments: Lindsay Medina is a 24 y.o. female with no PMHx who presents to the Emergency Department with an array of complaints including headache, breast discharge, and lower left abdominal pain presenting over the last month. Patient reports intermittent, 30 minute episodes of sharp headache extending from right temporal into occipital region. Patient denies associated neck pain. Patient describes profuse milk colored and clear discharge from the bilateral nipples. Patient reports constant left lower abdominal pain that has worsened today. Patient denies recent pregnancy or birth. Patient shares irregular periods for approximately six months now; she describes periods that last full seven days and short periods characterized by heavy bleeding and painful cramping. Patient denies urinary symptoms or abnormal vaginal discharge. Patient denies personal or family history of breast cancer; she does not self examen at home. Patient does not have PCP or GYN.    History reviewed. No pertinent past medical history. History reviewed. No pertinent past surgical history. No family history on file. History  Substance Use Topics  . Smoking status: Current Every Day Smoker -- 0.20 packs/day    Types: Cigarettes  . Smokeless tobacco: Not on file  . Alcohol Use: Yes     Comment: weekly   OB History    No data available     Review of Systems  A complete 10 system review of systems was obtained and all systems are negative except as noted in the HPI and PMH.     Allergies  Peanuts  Home Medications   Prior to Admission medications   Medication Sig Start Date End Date Taking? Authorizing Provider  ibuprofen (ADVIL,MOTRIN) 200 MG tablet Take 400 mg by mouth every 6 (six) hours as needed for moderate pain.    Yes Historical Provider, MD   Triage Vitals: BP 119/83 mmHg  Pulse 72  Temp(Src) 98.2 F (36.8 C) (Oral)  Resp 16  SpO2 100%  LMP 07/03/2014 Physical Exam  Constitutional: She is oriented to person, place, and time. She appears well-developed and well-nourished. No distress.  HENT:  Head: Normocephalic and atraumatic.  Mouth/Throat: Oropharynx is clear and moist.  Eyes: Conjunctivae and EOM are normal. Pupils are equal, round, and reactive to light.  No TTP of maxillary or frontal sinuses  No TTP or induration of temporal arteries bilaterally  Neck: Normal range of motion. Neck supple. No tracheal deviation present.  FROM to C-spine. Pt can touch chin to chest without discomfort. No TTP of midline cervical spine.   Cardiovascular: Normal rate, regular rhythm and intact distal pulses.   Pulmonary/Chest: Effort normal and breath sounds normal. No respiratory distress. She has no wheezes. She has no rales. She exhibits no tenderness.  Abdominal: Soft. Bowel sounds are normal. She exhibits no distension and no mass. There is no tenderness. There is no rebound and no guarding.  Musculoskeletal: Normal range of motion. She exhibits no edema or tenderness.  Neurological: She is alert and oriented to person, place, and time. No cranial nerve deficit.  II-Visual fields  grossly intact. III/IV/VI-Extraocular movements intact.  Pupils reactive bilaterally. V/VII-Smile symmetric, equal eyebrow raise,  facial sensation intact VIII- Hearing grossly intact IX/X-Normal gag XI-bilateral shoulder shrug XII-midline tongue extension Motor: 5/5 bilaterally with normal tone and bulk Cerebellar: Normal finger-to-nose  and normal heel-to-shin test.    Romberg negative Ambulates with a coordinated gait   Skin: Skin is warm and dry.  Psychiatric: She has a normal mood and affect. Her behavior is normal.  Nursing note and vitals reviewed.   ED Course  Procedures (including critical care time)  COORDINATION OF CARE: 7:17 PM- Discussed treatment plan with patient at bedside and patient agreed to plan.   Labs Review Labs Reviewed  CBC WITH DIFFERENTIAL/PLATELET - Abnormal; Notable for the following:    WBC 14.6 (*)    Neutro Abs 9.9 (*)    Monocytes Absolute 1.1 (*)    All other components within normal limits  COMPREHENSIVE METABOLIC PANEL - Abnormal; Notable for the following:    BUN 5 (*)    Total Bilirubin 1.3 (*)    All other components within normal limits  LIPASE, BLOOD - Abnormal; Notable for the following:    Lipase 20 (*)    All other components within normal limits  URINALYSIS, ROUTINE W REFLEX MICROSCOPIC (NOT AT [facility]) - Abnormal; Notable for the following:    Color, Urine AMBER (*)    APPearance TURBID (*)    Nitrite POSITIVE (*)    Leukocytes, UA SMALL (*)    All other components within normal limits  URINE MICROSCOPIC-ADD ON - Abnormal; Notable for the following:    Squamous Epithelial / LPF MANY (*)    Bacteria, UA MANY (*)    All other components within normal limits  URINE CULTURE  PROLACTIN  URINE RAPID DRUG SCREEN, HOSP PERFORMED  POC URINE PREG, ED    Imaging Review Ct Head Wo Contrast  07/24/2014   CLINICAL DATA:  Headache behind right ear radiating posteriorly 1 month. Pain worse today. Occasional nausea, dizziness and blurred vision.  EXAM: CT HEAD WITHOUT CONTRAST  TECHNIQUE: Contiguous axial images were obtained from the base of the skull through the vertex without intravenous contrast.  COMPARISON:  None.  FINDINGS: Ventricles, cisterns and other CSF spaces are within normal. There is no mass, mass effect, shift of midline structures or acute hemorrhage. There is no evidence of acute infarction.  Bones and soft tissue structures are within normal.  IMPRESSION: Normal head CT.   Electronically Signed   By: Marin Olp M.D.   On: 07/24/2014 20:25     EKG Interpretation None      MDM   Final diagnoses:  None    Filed Vitals:   07/24/14 1634 07/24/14 2104  BP: 119/83 89/63  Pulse: 72 67  Temp: 98.2 F (36.8 C)   TempSrc: Oral   Resp: 16 16  SpO2: 100% 94%    Medications  sodium chloride 0.9 % bolus 1,000 mL (0 mLs Intravenous Stopped 07/24/14 2156)  methylPREDNISolone sodium succinate (SOLU-MEDROL) 125 mg/2 mL injection 125 mg (125 mg Intravenous Given 07/24/14 2028)  diphenhydrAMINE (BENADRYL) injection 25 mg (25 mg Intravenous Given 07/24/14 2027)  prochlorperazine (COMPAZINE) injection 10 mg (10 mg Intravenous Given 07/24/14 2030)  magnesium sulfate IVPB 2 g 50 mL (0 g Intravenous Stopped 07/24/14 2156)    Lindsay Medina is a pleasant 24 y.o. female presenting with headache, left lower quadrant abdominal pain, galactorrhea bilaterally over last month. Head CT does not show any abnormality including  pituitary adenoma. Patient has positive nitrites and small leukocytes however she denies dysuria, urinary frequency, hematuria. She has many bacteria and 3-6 white cells however it is highly contaminated, this is a asymptomatic bacteriuria, we'll not treat at this time.  I would like to obtain an MRI of this patient but she is refusing, she needs to leave to pick up her children. I'm concerned because this patient has no outpatient care  or insurance. I've explained to her that the prolactin level and MRI will need to be followed up. I have discussed the case with family medicine resident to cannot set up an appointment for this patient as it is the weekend however he encourages her to call the office Monday morning to set up an appointment he states that her not being sure should not be an issue, they can help her obtain an orange card.  Evaluation does not show pathology that  would require ongoing emergent intervention or inpatient treatment. Pt is hemodynamically stable and mentating appropriately. Discussed findings and plan with patient/guardian, who agrees with care plan. All questions answered. Return precautions discussed and outpatient follow up given.   Discharge Medication List as of 07/24/2014  9:33 PM    START taking these medications   Details  butalbital-acetaminophen-caffeine (FIORICET) 50-325-40 MG per tablet Take 1 tablet by mouth every 6 (six) hours as needed for headache., Starting 07/24/2014, Until Discontinued, Print        I personally performed the services described in this documentation, which was scribed in my presence. The recorded information has been reviewed and is accurate.       Elmyra Ricks Debby Clyne, PA-C 07/24/14 7616  Varney Biles, MD 07/25/14 2316

## 2014-07-27 LAB — PROLACTIN: PROLACTIN: 11.1 ng/mL (ref 4.8–23.3)

## 2014-07-27 LAB — URINE CULTURE: Culture: >=100000

## 2014-07-29 ENCOUNTER — Telehealth (HOSPITAL_COMMUNITY): Payer: Self-pay

## 2014-07-29 NOTE — Telephone Encounter (Signed)
Post ED Visit - Positive Culture Follow-up: Chart Hand-off to ED Flow Manager  Culture assessed and recommendations reviewed by: []  Levester Fresh, Pharm.D., BCPS []  Heide Guile, Pharm.D., BCPS-AQ ID []  Alycia Rossetti, Pharm.D., BCPS []  Talent, Pharm.D., BCPS, AAHIVP [x]  Legrand Como, Pharm .D., BCPS, AAHIVP   Positive Urine culture  [x]  Patient discharged without antimicrobial prescription and treatment is now indicated []  Organism is resistant to prescribed ED discharge antimicrobial []  Patient with positive blood cultures  Changes discussed with ED provider: Clayton Bibles PA New antibiotic prescription Symptom check. If symptomatic for UTI call in Keflex 500mg  po bid x 7 days.   Letter sent to address on file.    Ileene Musa 07/29/2014, 11:30 AM

## 2014-09-14 ENCOUNTER — Telehealth (HOSPITAL_COMMUNITY): Payer: Self-pay | Admitting: *Deleted

## 2015-02-01 ENCOUNTER — Encounter (HOSPITAL_COMMUNITY): Payer: Self-pay

## 2015-02-01 ENCOUNTER — Inpatient Hospital Stay (HOSPITAL_COMMUNITY)
Admission: AD | Admit: 2015-02-01 | Discharge: 2015-02-01 | Disposition: A | Discharge status: Home / Self Care | Payer: Self-pay | Source: Ambulatory Visit | Attending: Family Medicine | Admitting: Family Medicine

## 2015-02-01 DIAGNOSIS — N926 Irregular menstruation, unspecified: Secondary | ICD-10-CM

## 2015-02-01 DIAGNOSIS — Z3201 Encounter for pregnancy test, result positive: Secondary | ICD-10-CM | POA: Insufficient documentation

## 2015-02-01 LAB — POCT PREGNANCY, URINE: Preg Test, Ur: POSITIVE — AB

## 2015-02-01 NOTE — MAU Provider Note (Signed)
S:  Lindsay Medina is a 25 y.o. G2P1001 at [redacted]w[redacted]d wks here for confirmation of pregnancy. Patient's last menstrual period was 11/21/2014.Marland Kitchen  Denies any vaginal bleeding or abdominal pain.  Plans to get prenatal care at unknown.   O:  No past medical history on file.  No family history on file.   Filed Vitals:   02/01/15 1547  BP: 106/81  Pulse: 81  Temp: 98 F (36.7 C)  Resp: 18    General:  A&OX3 with no signs of acute distress. She appears well-developed and well-nourished. No distress.  Neck: Normal range of motion.  Pulmonary/Chest: Effort normal. No respiratory distress.  Musculoskeletal: Normal range of motion.  Neurological: She is alert and oriented to person, place, and time.  Skin: Skin is warm and dry.   A:  1. Missed period   2. Encounter for pregnancy test, result positive     P: Explained benefits of taking prenatal vitamins w/folic acid early in pregnancy. Begin prenatal care. Reviewed warning signs of pregnancy. Pregnancy verification letter given.  Work note provided as requested by patient.   Lezlie Lye, NP 02/01/2015  3:57 PM

## 2015-02-01 NOTE — MAU Note (Signed)
Patient had positive home test last night, having breast soreness, LMP 11/21/14, no cramping at present.

## 2015-02-01 NOTE — Discharge Instructions (Signed)
First Trimester of Pregnancy The first trimester of pregnancy is from week 1 until the end of week 12 (months 1 through 3). A week after a sperm fertilizes an egg, the egg will implant on the wall of the uterus. This embryo will begin to develop into a baby. Genes from you and your partner are forming the baby. The female genes determine whether the baby is a boy or a girl. At 6-8 weeks, the eyes and face are formed, and the heartbeat can be seen on ultrasound. At the end of 12 weeks, all the baby's organs are formed.  Now that you are pregnant, you will want to do everything you can to have a healthy baby. Two of the most important things are to get good prenatal care and to follow your health care provider's instructions. Prenatal care is all the medical care you receive before the baby's birth. This care will help prevent, find, and treat any problems during the pregnancy and childbirth. BODY CHANGES Your body goes through many changes during pregnancy. The changes vary from woman to woman.   You may gain or lose a couple of pounds at first.  You may feel sick to your stomach (nauseous) and throw up (vomit). If the vomiting is uncontrollable, call your health care provider.  You may tire easily.  You may develop headaches that can be relieved by medicines approved by your health care provider.  You may urinate more often. Painful urination may mean you have a bladder infection.  You may develop heartburn as a result of your pregnancy.  You may develop constipation because certain hormones are causing the muscles that push waste through your intestines to slow down.  You may develop hemorrhoids or swollen, bulging veins (varicose veins).  Your breasts may begin to grow larger and become tender. Your nipples may stick out more, and the tissue that surrounds them (areola) may become darker.  Your gums may bleed and may be sensitive to brushing and flossing.  Dark spots or blotches (chloasma,  mask of pregnancy) may develop on your face. This will likely fade after the baby is born.  Your menstrual periods will stop.  You may have a loss of appetite.  You may develop cravings for certain kinds of food.  You may have changes in your emotions from day to day, such as being excited to be pregnant or being concerned that something may go wrong with the pregnancy and baby.  You may have more vivid and strange dreams.  You may have changes in your hair. These can include thickening of your hair, rapid growth, and changes in texture. Some women also have hair loss during or after pregnancy, or hair that feels dry or thin. Your hair will most likely return to normal after your baby is born. WHAT TO EXPECT AT YOUR PRENATAL VISITS During a routine prenatal visit:  You will be weighed to make sure you and the baby are growing normally.  Your blood pressure will be taken.  Your abdomen will be measured to track your baby's growth.  The fetal heartbeat will be listened to starting around week 10 or 12 of your pregnancy.  Test results from any previous visits will be discussed. Your health care provider may ask you:  How you are feeling.  If you are feeling the baby move.  If you have had any abnormal symptoms, such as leaking fluid, bleeding, severe headaches, or abdominal cramping.  If you are using any tobacco products,   including cigarettes, chewing tobacco, and electronic cigarettes.  If you have any questions. Other tests that may be performed during your first trimester include:  Blood tests to find your blood type and to check for the presence of any previous infections. They will also be used to check for low iron levels (anemia) and Rh antibodies. Later in the pregnancy, blood tests for diabetes will be done along with other tests if problems develop.  Urine tests to check for infections, diabetes, or protein in the urine.  An ultrasound to confirm the proper growth  and development of the baby.  An amniocentesis to check for possible genetic problems.  Fetal screens for spina bifida and Down syndrome.  You may need other tests to make sure you and the baby are doing well.  HIV (human immunodeficiency virus) testing. Routine prenatal testing includes screening for HIV, unless you choose not to have this test. HOME CARE INSTRUCTIONS  Medicines  Follow your health care provider's instructions regarding medicine use. Specific medicines may be either safe or unsafe to take during pregnancy.  Take your prenatal vitamins as directed.  If you develop constipation, try taking a stool softener if your health care provider approves. Diet  Eat regular, well-balanced meals. Choose a variety of foods, such as meat or vegetable-based protein, fish, milk and low-fat dairy products, vegetables, fruits, and whole grain breads and cereals. Your health care provider will help you determine the amount of weight gain that is right for you.  Avoid raw meat and uncooked cheese. These carry germs that can cause birth defects in the baby.  Eating four or five small meals rather than three large meals a day may help relieve nausea and vomiting. If you start to feel nauseous, eating a few soda crackers can be helpful. Drinking liquids between meals instead of during meals also seems to help nausea and vomiting.  If you develop constipation, eat more high-fiber foods, such as fresh vegetables or fruit and whole grains. Drink enough fluids to keep your urine clear or pale yellow. Activity and Exercise  Exercise only as directed by your health care provider. Exercising will help you:  Control your weight.  Stay in shape.  Be prepared for labor and delivery.  Experiencing pain or cramping in the lower abdomen or low back is a good sign that you should stop exercising. Check with your health care provider before continuing normal exercises.  Try to avoid standing for long  periods of time. Move your legs often if you must stand in one place for a long time.  Avoid heavy lifting.  Wear low-heeled shoes, and practice good posture.  You may continue to have sex unless your health care provider directs you otherwise. Relief of Pain or Discomfort  Wear a good support bra for breast tenderness.   Take warm sitz baths to soothe any pain or discomfort caused by hemorrhoids. Use hemorrhoid cream if your health care provider approves.   Rest with your legs elevated if you have leg cramps or low back pain.  If you develop varicose veins in your legs, wear support hose. Elevate your feet for 15 minutes, 3-4 times a day. Limit salt in your diet. Prenatal Care  Schedule your prenatal visits by the twelfth week of pregnancy. They are usually scheduled monthly at first, then more often in the last 2 months before delivery.  Write down your questions. Take them to your prenatal visits.  Keep all your prenatal visits as directed by your   health care provider. Safety  Wear your seat belt at all times when driving.  Make a list of emergency phone numbers, including numbers for family, friends, the hospital, and police and fire departments. General Tips  Ask your health care provider for a referral to a local prenatal education class. Begin classes no later than at the beginning of month 6 of your pregnancy.  Ask for help if you have counseling or nutritional needs during pregnancy. Your health care provider can offer advice or refer you to specialists for help with various needs.  Do not use hot tubs, steam rooms, or saunas.  Do not douche or use tampons or scented sanitary pads.  Do not cross your legs for long periods of time.  Avoid cat litter boxes and soil used by cats. These carry germs that can cause birth defects in the baby and possibly loss of the fetus by miscarriage or stillbirth.  Avoid all smoking, herbs, alcohol, and medicines not prescribed by  your health care provider. Chemicals in these affect the formation and growth of the baby.  Do not use any tobacco products, including cigarettes, chewing tobacco, and electronic cigarettes. If you need help quitting, ask your health care provider. You may receive counseling support and other resources to help you quit.  Schedule a dentist appointment. At home, brush your teeth with a soft toothbrush and be gentle when you floss. SEEK MEDICAL CARE IF:   You have dizziness.  You have mild pelvic cramps, pelvic pressure, or nagging pain in the abdominal area.  You have persistent nausea, vomiting, or diarrhea.  You have a bad smelling vaginal discharge.  You have pain with urination.  You notice increased swelling in your face, hands, legs, or ankles. SEEK IMMEDIATE MEDICAL CARE IF:   You have a fever.  You are leaking fluid from your vagina.  You have spotting or bleeding from your vagina.  You have severe abdominal cramping or pain.  You have rapid weight gain or loss.  You vomit blood or material that looks like coffee grounds.  You are exposed to German measles and have never had them.  You are exposed to fifth disease or chickenpox.  You develop a severe headache.  You have shortness of breath.  You have any kind of trauma, such as from a fall or a car accident.   This information is not intended to replace advice given to you by your health care provider. Make sure you discuss any questions you have with your health care provider.   Document Released: 01/10/2001 Document Revised: 02/06/2014 Document Reviewed: 11/26/2012 Elsevier Interactive Patient Education 2016 [employer]  

## 2015-02-22 ENCOUNTER — Other Ambulatory Visit (HOSPITAL_COMMUNITY): Payer: Self-pay | Admitting: Physician Assistant

## 2015-02-22 DIAGNOSIS — Z369 Encounter for antenatal screening, unspecified: Secondary | ICD-10-CM

## 2015-02-26 ENCOUNTER — Ambulatory Visit (HOSPITAL_COMMUNITY): Payer: Self-pay

## 2015-03-14 ENCOUNTER — Inpatient Hospital Stay (HOSPITAL_COMMUNITY): Payer: Medicaid Other

## 2015-03-14 ENCOUNTER — Inpatient Hospital Stay (HOSPITAL_COMMUNITY)
Admission: AD | Admit: 2015-03-14 | Discharge: 2015-03-14 | Disposition: A | Discharge status: Home / Self Care | Payer: Medicaid Other | Source: Ambulatory Visit | Attending: Obstetrics & Gynecology | Admitting: Obstetrics & Gynecology

## 2015-03-14 ENCOUNTER — Encounter (HOSPITAL_COMMUNITY): Payer: Self-pay

## 2015-03-14 DIAGNOSIS — O98511 Other viral diseases complicating pregnancy, first trimester: Secondary | ICD-10-CM | POA: Insufficient documentation

## 2015-03-14 DIAGNOSIS — O99331 Smoking (tobacco) complicating pregnancy, first trimester: Secondary | ICD-10-CM | POA: Insufficient documentation

## 2015-03-14 DIAGNOSIS — A599 Trichomoniasis, unspecified: Secondary | ICD-10-CM | POA: Diagnosis not present

## 2015-03-14 DIAGNOSIS — Z3A12 12 weeks gestation of pregnancy: Secondary | ICD-10-CM | POA: Diagnosis not present

## 2015-03-14 DIAGNOSIS — R0989 Other specified symptoms and signs involving the circulatory and respiratory systems: Secondary | ICD-10-CM

## 2015-03-14 DIAGNOSIS — R05 Cough: Secondary | ICD-10-CM | POA: Diagnosis present

## 2015-03-14 DIAGNOSIS — B349 Viral infection, unspecified: Secondary | ICD-10-CM

## 2015-03-14 DIAGNOSIS — O26899 Other specified pregnancy related conditions, unspecified trimester: Secondary | ICD-10-CM

## 2015-03-14 DIAGNOSIS — R509 Fever, unspecified: Secondary | ICD-10-CM | POA: Diagnosis present

## 2015-03-14 DIAGNOSIS — O9989 Other specified diseases and conditions complicating pregnancy, childbirth and the puerperium: Secondary | ICD-10-CM

## 2015-03-14 DIAGNOSIS — R109 Unspecified abdominal pain: Secondary | ICD-10-CM

## 2015-03-14 LAB — CBC
HCT: 34.8 % — ABNORMAL LOW (ref 36.0–46.0)
HEMOGLOBIN: 12.3 g/dL (ref 12.0–15.0)
MCH: 29.6 pg (ref 26.0–34.0)
MCHC: 35.3 g/dL (ref 30.0–36.0)
MCV: 83.7 fL (ref 78.0–100.0)
PLATELETS: 259 10*3/uL (ref 150–400)
RBC: 4.16 MIL/uL (ref 3.87–5.11)
RDW: 12.7 % (ref 11.5–15.5)
WBC: 9.8 10*3/uL (ref 4.0–10.5)

## 2015-03-14 LAB — URINE MICROSCOPIC-ADD ON

## 2015-03-14 LAB — COMPREHENSIVE METABOLIC PANEL
ALBUMIN: 3.8 g/dL (ref 3.5–5.0)
ALK PHOS: 41 U/L (ref 38–126)
ALT: 14 U/L (ref 14–54)
ANION GAP: 9 (ref 5–15)
AST: 26 U/L (ref 15–41)
BUN: 6 mg/dL (ref 6–20)
CO2: 19 mmol/L — AB (ref 22–32)
Calcium: 9.2 mg/dL (ref 8.9–10.3)
Chloride: 106 mmol/L (ref 101–111)
Creatinine, Ser: 0.51 mg/dL (ref 0.44–1.00)
GFR calc Af Amer: >60 mL/min (ref >60)
GFR calc non Af Amer: >60 mL/min (ref >60)
Glucose, Bld: 92 mg/dL (ref 65–99)
POTASSIUM: 3.7 mmol/L (ref 3.5–5.1)
SODIUM: 134 mmol/L — AB (ref 135–145)
Total Bilirubin: 0.4 mg/dL (ref 0.3–1.2)
Total Protein: 7.5 g/dL (ref 6.5–8.1)

## 2015-03-14 LAB — URINALYSIS, ROUTINE W REFLEX MICROSCOPIC
BILIRUBIN URINE: NEGATIVE
Glucose, UA: NEGATIVE mg/dL
Ketones, ur: >80 mg/dL — AB
LEUKOCYTES UA: NEGATIVE
NITRITE: NEGATIVE
PROTEIN: 30 mg/dL — AB
Specific Gravity, Urine: >1.03 — ABNORMAL HIGH (ref 1.005–1.030)
pH: 6 (ref 5.0–8.0)

## 2015-03-14 LAB — RAPID STREP SCREEN (MED CTR MEBANE ONLY): Streptococcus, Group A Screen (Direct): NEGATIVE

## 2015-03-14 MED ORDER — GUAIFENESIN-CODEINE 100-10 MG/5ML PO SYRP: 5.0000 mL | ORAL_SOLUTION | Freq: Every evening | ORAL | Status: DC | PRN

## 2015-03-14 MED ORDER — LACTATED RINGERS IV BOLUS (SEPSIS)
1000.0000 mL | Freq: Once | INTRAVENOUS | Status: AC
  Administered 2015-03-14: 1000 mL via INTRAVENOUS

## 2015-03-14 MED ORDER — OSELTAMIVIR PHOSPHATE 75 MG PO CAPS
75.0000 mg | ORAL_CAPSULE | Freq: Once | ORAL | Status: AC
  Administered 2015-03-14: 75 mg via ORAL
  Filled 2015-03-14: qty 1

## 2015-03-14 MED ORDER — METRONIDAZOLE 500 MG PO TABS
2000.0000 mg | ORAL_TABLET | Freq: Once | ORAL | Status: AC
  Administered 2015-03-14: 2000 mg via ORAL
  Filled 2015-03-14: qty 4

## 2015-03-14 MED ORDER — DEXTROSE 5 % IN LACTATED RINGERS IV BOLUS
1000.0000 mL | Freq: Once | INTRAVENOUS | Status: AC
  Administered 2015-03-14: 1000 mL via INTRAVENOUS

## 2015-03-14 MED ORDER — ACETAMINOPHEN 500 MG PO TABS
1000.0000 mg | ORAL_TABLET | Freq: Once | ORAL | Status: AC
  Administered 2015-03-14: 1000 mg via ORAL
  Filled 2015-03-14: qty 2

## 2015-03-14 MED ORDER — OSELTAMIVIR PHOSPHATE 75 MG PO CAPS: 75.0000 mg | ORAL_CAPSULE | Freq: Two times a day (BID) | ORAL | Status: DC

## 2015-03-14 MED ORDER — ONDANSETRON 8 MG PO TBDP
8.0000 mg | ORAL_TABLET | Freq: Once | ORAL | Status: AC
  Administered 2015-03-14: 8 mg via ORAL
  Filled 2015-03-14: qty 1

## 2015-03-14 NOTE — MAU Note (Signed)
Pt c/o cough, fever, n/v since Friday. Having some lower abdominal cramping also but denies bleeding.

## 2015-03-14 NOTE — MAU Note (Signed)
Pt reports abd cramping, back pain, cough, and headache. Symptoms x 2 days.

## 2015-03-14 NOTE — MAU Provider Note (Signed)
History     CSN: [id number]  Arrival date and time: 03/14/15 1856   None     No chief complaint on file.  HPI Pt is [redacted]w[redacted]d by LMP G2P1001 but reports [redacted]week gestation- is going to [facility] for prenatal care. Pt presents with fever, cough, headache, lower  abd cramping and back pain for 2 days.  RN note: Pt reports abd cramping, back pain, cough, and headache. Symptoms x 2 days.   No past medical history on file.  No past surgical history on file.  No family history on file.  Social History  Substance Use Topics  . Smoking status: Current Every Day Smoker -- 0.20 packs/day    Types: Cigarettes  . Smokeless tobacco: Not on file  . Alcohol Use: Yes     Comment: weekly    Allergies:  Allergies  Allergen Reactions  . Peanuts [Peanut Oil]     Prescriptions prior to admission  Medication Sig Dispense Refill Last Dose  . butalbital-acetaminophen-caffeine (FIORICET) 50-325-40 MG per tablet Take 1 tablet by mouth every 6 (six) hours as needed for headache. 20 tablet 0     Review of Systems  Constitutional: Positive for fever and chills.  Gastrointestinal: Positive for nausea, vomiting and abdominal pain. Negative for diarrhea and constipation.  Genitourinary: Negative for dysuria.  Musculoskeletal: Positive for back pain.  Neurological: Positive for headaches.   Physical Exam   Blood pressure 111/63, pulse 130, temperature 100 F (37.8 C), temperature source Oral, resp. rate 20, height 5\' 2"  (1.575 m), weight 153 lb (69.4 kg), last menstrual period 11/21/2014, SpO2 100 %.  Physical Exam  Vitals reviewed. Constitutional: She is oriented to person, place, and time. She appears well-developed and well-nourished.  HENT:  Head: Normocephalic.  Mouth/Throat: No oropharyngeal exudate.  Eyes: Pupils are equal, round, and reactive to light.  Neck: Normal range of motion.  Cardiovascular:  tachycardia  Respiratory: Effort normal.  GI: Soft. She exhibits no distension. There  is no tenderness. There is no rebound and no guarding.  No FHT with doppler- US ordered  Musculoskeletal: Normal range of motion.  Neurological: She is alert and oriented to person, place, and time.  Skin: Skin is warm. There is pallor.  Psychiatric: She has a normal mood and affect.    MAU Course  Procedures IVF D5LR zofran 8mg  ODT Tylenol 1,000mg  Results for orders placed or performed during the hospital encounter of 03/14/15 (from the past 24 hour(s))  Urinalysis, Routine w reflex microscopic (not at [facility])     Status: Abnormal   Collection Time: 03/14/15  7:15 PM  Result Value Ref Range   Color, Urine YELLOW YELLOW   APPearance CLEAR CLEAR   Specific Gravity, Urine >1.030 (H) 1.005 - 1.030   pH 6.0 5.0 - 8.0   Glucose, UA NEGATIVE NEGATIVE mg/dL   Hgb urine dipstick SMALL (A) NEGATIVE   Bilirubin Urine NEGATIVE NEGATIVE   Ketones, ur >80 (A) NEGATIVE mg/dL   Protein, ur 30 (A) NEGATIVE mg/dL   Nitrite NEGATIVE NEGATIVE   Leukocytes, UA NEGATIVE NEGATIVE  Urine microscopic-add on     Status: Abnormal   Collection Time: 03/14/15  7:15 PM  Result Value Ref Range   Squamous Epithelial / LPF 6-30 (A) NONE SEEN   WBC, UA 6-30 0 - 5 WBC/hpf   RBC / HPF 0-5 0 - 5 RBC/hpf   Bacteria, UA MANY (A) NONE SEEN   Trichomonas, UA PRESENT    Urine-Other MUCOUS PRESENT   CBC  Status: Abnormal   Collection Time: 03/14/15  8:10 PM  Result Value Ref Range   WBC 9.8 4.0 - 10.5 K/uL   RBC 4.16 3.87 - 5.11 MIL/uL   Hemoglobin 12.3 12.0 - 15.0 g/dL   HCT 34.8 (L) 36.0 - 46.0 %   MCV 83.7 78.0 - 100.0 fL   MCH 29.6 26.0 - 34.0 pg   MCHC 35.3 30.0 - 36.0 g/dL   RDW 12.7 11.5 - 15.5 %   Platelets 259 150 - 400 K/uL  Comprehensive metabolic panel     Status: Abnormal   Collection Time: 03/14/15  8:10 PM  Result Value Ref Range   Sodium 134 (L) 135 - 145 mmol/L   Potassium 3.7 3.5 - 5.1 mmol/L   Chloride 106 101 - 111 mmol/L   CO2 19 (L) 22 - 32 mmol/L   Glucose, Bld 92 65 - 99 mg/dL    BUN 6 6 - 20 mg/dL   Creatinine, Ser 0.51 0.44 - 1.00 mg/dL   Calcium 9.2 8.9 - 10.3 mg/dL   Total Protein 7.5 6.5 - 8.1 g/dL   Albumin 3.8 3.5 - 5.0 g/dL   AST 26 15 - 41 U/L   ALT 14 14 - 54 U/L   Alkaline Phosphatase 41 38 - 126 U/L   Total Bilirubin 0.4 0.3 - 1.2 mg/dL   GFR calc non Af Amer >60 >60 mL/min   GFR calc Af Amer >60 >60 mL/min   Anion gap 9 5 - 15  wet prep GC/chlamydia ordered Strept throat culture and flu results pending Care turned over to [facility], CNM [facility] 03/14/2015, 7:22 PM    2130 In room for reassessment; pt visibly with chills (shaking); appears uncomfortable; Notified pt regarding +trich in urine and need for treatment.  Plans to discuss with partner at home (partner asked to go to lobby when results given); lungs with scattered rales in bases.   CMT - negative;  Chest xray ordered.    XRAY: FINDINGS: The heart size and mediastinal contours are within normal limits. Both lungs are clear. The visualized skeletal structures are unremarkable. Assessment and Plan  25 y.o. G2P1001 at [redacted]w[redacted]d IUP Viral Illness Trichomoniasis  Plan: Discharge home Increase fluids Tamiflu 75 mg BID x 5 days Treated for trich in MAU; pt declines partner tx; Understands he needs treatment Flu PCR pending Follow-up if no improvement or worsening of symptoms  Gwen Pounds, CNM

## 2015-03-15 ENCOUNTER — Ambulatory Visit (HOSPITAL_COMMUNITY): Admission: RE | Admit: 2015-03-15 | Payer: Self-pay | Source: Ambulatory Visit

## 2015-03-15 LAB — INFLUENZA PANEL BY PCR (TYPE A & B)
H1N1 flu by pcr: DETECTED — AB
INFLAPCR: POSITIVE — AB
Influenza B By PCR: NEGATIVE

## 2015-03-15 LAB — GC/CHLAMYDIA PROBE AMP (~~LOC~~) NOT AT ARMC
CHLAMYDIA, DNA PROBE: NEGATIVE
Neisseria Gonorrhea: NEGATIVE

## 2015-03-17 LAB — CULTURE, GROUP A STREP (THRC)

## 2015-03-24 ENCOUNTER — Ambulatory Visit (HOSPITAL_COMMUNITY): Payer: Medicaid Other

## 2015-05-19 ENCOUNTER — Telehealth: Payer: Self-pay | Admitting: Student

## 2015-05-19 ENCOUNTER — Inpatient Hospital Stay (HOSPITAL_COMMUNITY)
Admission: AD | Admit: 2015-05-19 | Discharge: 2015-05-19 | Disposition: A | Discharge status: Home / Self Care | Payer: Medicaid Other | Source: Ambulatory Visit | Attending: Family Medicine | Admitting: Family Medicine

## 2015-05-19 ENCOUNTER — Encounter (HOSPITAL_COMMUNITY): Payer: Self-pay | Admitting: *Deleted

## 2015-05-19 DIAGNOSIS — R05 Cough: Secondary | ICD-10-CM | POA: Diagnosis not present

## 2015-05-19 DIAGNOSIS — Z3A22 22 weeks gestation of pregnancy: Secondary | ICD-10-CM | POA: Insufficient documentation

## 2015-05-19 DIAGNOSIS — J101 Influenza due to other identified influenza virus with other respiratory manifestations: Secondary | ICD-10-CM

## 2015-05-19 DIAGNOSIS — O219 Vomiting of pregnancy, unspecified: Secondary | ICD-10-CM

## 2015-05-19 DIAGNOSIS — Z87891 Personal history of nicotine dependence: Secondary | ICD-10-CM | POA: Insufficient documentation

## 2015-05-19 DIAGNOSIS — O212 Late vomiting of pregnancy: Secondary | ICD-10-CM | POA: Diagnosis not present

## 2015-05-19 DIAGNOSIS — O26892 Other specified pregnancy related conditions, second trimester: Secondary | ICD-10-CM | POA: Diagnosis not present

## 2015-05-19 DIAGNOSIS — Z91010 Allergy to peanuts: Secondary | ICD-10-CM | POA: Diagnosis not present

## 2015-05-19 DIAGNOSIS — R1013 Epigastric pain: Secondary | ICD-10-CM | POA: Diagnosis present

## 2015-05-19 DIAGNOSIS — R059 Cough, unspecified: Secondary | ICD-10-CM

## 2015-05-19 HISTORY — DX: Other specified health status: Z78.9

## 2015-05-19 LAB — URINALYSIS, ROUTINE W REFLEX MICROSCOPIC
BILIRUBIN URINE: NEGATIVE
Glucose, UA: NEGATIVE mg/dL
HGB URINE DIPSTICK: NEGATIVE
Leukocytes, UA: NEGATIVE
NITRITE: NEGATIVE
PROTEIN: 30 mg/dL — AB
pH: 6 (ref 5.0–8.0)

## 2015-05-19 LAB — COMPREHENSIVE METABOLIC PANEL
ALBUMIN: 3.2 g/dL — AB (ref 3.5–5.0)
ALT: 22 U/L (ref 14–54)
AST: 44 U/L — AB (ref 15–41)
Alkaline Phosphatase: 46 U/L (ref 38–126)
Anion gap: 7 (ref 5–15)
BUN: 6 mg/dL (ref 6–20)
CHLORIDE: 107 mmol/L (ref 101–111)
CO2: 18 mmol/L — AB (ref 22–32)
CREATININE: 0.5 mg/dL (ref 0.44–1.00)
Calcium: 8.2 mg/dL — ABNORMAL LOW (ref 8.9–10.3)
GFR calc Af Amer: >60 mL/min (ref >60)
Glucose, Bld: 84 mg/dL (ref 65–99)
POTASSIUM: 3.6 mmol/L (ref 3.5–5.1)
SODIUM: 132 mmol/L — AB (ref 135–145)
Total Bilirubin: 0.4 mg/dL (ref 0.3–1.2)
Total Protein: 6.9 g/dL (ref 6.5–8.1)

## 2015-05-19 LAB — CBC
HCT: 33.6 % — ABNORMAL LOW (ref 36.0–46.0)
Hemoglobin: 11.4 g/dL — ABNORMAL LOW (ref 12.0–15.0)
MCH: 28.9 pg (ref 26.0–34.0)
MCHC: 33.9 g/dL (ref 30.0–36.0)
MCV: 85.3 fL (ref 78.0–100.0)
PLATELETS: 209 10*3/uL (ref 150–400)
RBC: 3.94 MIL/uL (ref 3.87–5.11)
RDW: 13.7 % (ref 11.5–15.5)
WBC: 5.9 10*3/uL (ref 4.0–10.5)

## 2015-05-19 LAB — AMYLASE: AMYLASE: 65 U/L (ref 28–100)

## 2015-05-19 LAB — LIPASE, BLOOD: Lipase: 23 U/L (ref 11–51)

## 2015-05-19 LAB — URINE MICROSCOPIC-ADD ON

## 2015-05-19 LAB — INFLUENZA PANEL BY PCR (TYPE A & B)
H1N1 flu by pcr: NOT DETECTED
INFLAPCR: NEGATIVE
Influenza B By PCR: POSITIVE — AB

## 2015-05-19 MED ORDER — OSELTAMIVIR PHOSPHATE 75 MG PO CAPS: 75.0000 mg | ORAL_CAPSULE | Freq: Two times a day (BID) | ORAL | Status: DC

## 2015-05-19 MED ORDER — BENZONATATE 100 MG PO CAPS: 100.0000 mg | ORAL_CAPSULE | Freq: Three times a day (TID) | ORAL | Status: DC

## 2015-05-19 MED ORDER — ONDANSETRON 8 MG PO TBDP
8.0000 mg | ORAL_TABLET | Freq: Once | ORAL | Status: AC
  Administered 2015-05-19: 8 mg via ORAL
  Filled 2015-05-19: qty 1

## 2015-05-19 MED ORDER — BENZONATATE 100 MG PO CAPS
100.0000 mg | ORAL_CAPSULE | Freq: Once | ORAL | Status: AC
  Administered 2015-05-19: 100 mg via ORAL
  Filled 2015-05-19: qty 1

## 2015-05-19 MED ORDER — IBUPROFEN 600 MG PO TABS
600.0000 mg | ORAL_TABLET | Freq: Once | ORAL | Status: AC
  Administered 2015-05-19: 600 mg via ORAL
  Filled 2015-05-19: qty 1

## 2015-05-19 MED ORDER — ONDANSETRON 8 MG PO TBDP: 8.0000 mg | ORAL_TABLET | Freq: Three times a day (TID) | ORAL | Status: DC | PRN

## 2015-05-19 MED ORDER — FAMOTIDINE IN NACL 20-0.9 MG/50ML-% IV SOLN
20.0000 mg | Freq: Once | INTRAVENOUS | Status: AC
  Administered 2015-05-19: 20 mg via INTRAVENOUS
  Filled 2015-05-19: qty 50

## 2015-05-19 MED ORDER — PROMETHAZINE HCL 25 MG/ML IJ SOLN
25.0000 mg | Freq: Once | INTRAVENOUS | Status: AC
  Administered 2015-05-19: 25 mg via INTRAVENOUS
  Filled 2015-05-19: qty 1

## 2015-05-19 MED ORDER — GI COCKTAIL ~~LOC~~
30.0000 mL | Freq: Once | ORAL | Status: AC
  Administered 2015-05-19: 30 mL via ORAL
  Filled 2015-05-19: qty 30

## 2015-05-19 NOTE — Discharge Instructions (Signed)

## 2015-05-19 NOTE — MAU Note (Signed)
No UC's, toco D/C'd

## 2015-05-19 NOTE — MAU Note (Signed)
Toco applied

## 2015-05-19 NOTE — MAU Note (Addendum)
Poor venous access, unable to start IV.  This note written on incorrect patient medical record. Please disregard.

## 2015-05-19 NOTE — MAU Note (Signed)
Abdominal pain X 3 days, getting worse. Points to armpits down to lower abd and all across. States pain is constant. C/o nausea and vomiting. No diarrhea. No bleeding.

## 2015-05-19 NOTE — MAU Provider Note (Signed)
History     CSN: [id number]  Arrival date and time: 05/19/15 0707   First Provider Initiated Contact with Patient 05/19/15 [phone]      Chief Complaint  Patient presents with  . Abdominal Pain   HPI Comments: Lindsay Medina is a 25 y.o. G2P1001 at [redacted]w[redacted]d who presents today with upper abdominal pain x 2 days. She states that she started coughing, and the pain has continued to worsen. She states that she feels like she pulled a muscle. She denies any VB or LOF. She denies any contractions. She states that the fetus has been moving normally.   Abdominal Pain This is a new problem. Episode onset: 2 days ago. The onset quality is gradual. The problem occurs constantly. The problem has been gradually worsening. The pain is located in the epigastric region. The pain is at a severity of 10/10. Quality: throbbing. Pain radiation: radiates to all over the entire abdomen. Associated symptoms include nausea and vomiting ("often" "I havent eaten anything, and it makes it hurt worse". ). Pertinent negatives include no constipation, diarrhea, dysuria, fever or frequency. The pain is aggravated by coughing. The pain is relieved by nothing. She has tried nothing for the symptoms.    Past Medical History  Diagnosis Date  . Medical history non-contributory     History reviewed. No pertinent past surgical history.  History reviewed. No pertinent family history.  Social History  Substance Use Topics  . Smoking status: Former Smoker -- 0.20 packs/day    Types: Cigarettes  . Smokeless tobacco: None  . Alcohol Use: Yes     Comment: weekly    Allergies:  Allergies  Allergen Reactions  . Peanuts [Peanut Oil] Anaphylaxis    Prescriptions prior to admission  Medication Sig Dispense Refill Last Dose  . acetaminophen (TYLENOL) 325 MG tablet Take 650 mg by mouth every 6 (six) hours as needed for mild pain or headache.    03/14/2015 at 1530  . butalbital-acetaminophen-caffeine (FIORICET) 50-325-40 MG  per tablet Take 1 tablet by mouth every 6 (six) hours as needed for headache. 20 tablet 0 Past Month at Unknown time  . guaiFENesin-codeine (ROBITUSSIN AC) 100-10 MG/5ML syrup Take 5 mLs by mouth at bedtime as needed for cough. 120 mL 0   . oseltamivir (TAMIFLU) 75 MG capsule Take 1 capsule (75 mg total) by mouth every 12 (twelve) hours. 10 capsule 0   . Prenatal Vit-Fe Fumarate-FA (PRENATAL MULTIVITAMIN) TABS tablet Take 1 tablet by mouth daily.    03/14/2015 at Unknown time    Review of Systems  Constitutional: Negative for fever and chills.  Gastrointestinal: Positive for nausea, vomiting ("often" "I havent eaten anything, and it makes it hurt worse". ) and abdominal pain. Negative for diarrhea and constipation.  Genitourinary: Negative for dysuria, urgency and frequency.   Physical Exam   Blood pressure 120/65, pulse 96, temperature 98.5 F (36.9 C), temperature source Oral, resp. rate 16, last menstrual period 11/21/2014.  Physical Exam  Nursing note and vitals reviewed. Constitutional: She is oriented to person, place, and time. She appears well-nourished. She appears distressed (appears uncomfortable. ).  HENT:  Head: Normocephalic.  Cardiovascular: Normal rate.   Respiratory: Effort normal.  GI: Soft. There is tenderness (generalized. ). There is no rebound.  Genitourinary:  FHT: 164 with doppler Uterus: AGA  Neurological: She is alert and oriented to person, place, and time.  Skin: Skin is warm and dry.  Psychiatric: She has a normal mood and affect.   Toco: no  UCs tracing.  MAU Course  Procedures Results for orders placed or performed during the hospital encounter of 05/19/15 (from the past 24 hour(s))  CBC     Status: Abnormal   Collection Time: 05/19/15  8:10 AM  Result Value Ref Range   WBC 5.9 4.0 - 10.5 K/uL   RBC 3.94 3.87 - 5.11 MIL/uL   Hemoglobin 11.4 (L) 12.0 - 15.0 g/dL   HCT 33.6 (L) 36.0 - 46.0 %   MCV 85.3 78.0 - 100.0 fL   MCH 28.9 26.0 - 34.0 pg    MCHC 33.9 30.0 - 36.0 g/dL   RDW 13.7 11.5 - 15.5 %   Platelets 209 150 - 400 K/uL  Comprehensive metabolic panel     Status: Abnormal   Collection Time: 05/19/15  8:10 AM  Result Value Ref Range   Sodium 132 (L) 135 - 145 mmol/L   Potassium 3.6 3.5 - 5.1 mmol/L   Chloride 107 101 - 111 mmol/L   CO2 18 (L) 22 - 32 mmol/L   Glucose, Bld 84 65 - 99 mg/dL   BUN 6 6 - 20 mg/dL   Creatinine, Ser 0.50 0.44 - 1.00 mg/dL   Calcium 8.2 (L) 8.9 - 10.3 mg/dL   Total Protein 6.9 6.5 - 8.1 g/dL   Albumin 3.2 (L) 3.5 - 5.0 g/dL   AST 44 (H) 15 - 41 U/L   ALT 22 14 - 54 U/L   Alkaline Phosphatase 46 38 - 126 U/L   Total Bilirubin 0.4 0.3 - 1.2 mg/dL   GFR calc non Af Amer >60 >60 mL/min   GFR calc Af Amer >60 >60 mL/min   Anion gap 7 5 - 15  Amylase     Status: None   Collection Time: 05/19/15  8:10 AM  Result Value Ref Range   Amylase 65 28 - 100 U/L  Lipase, blood     Status: None   Collection Time: 05/19/15  8:10 AM  Result Value Ref Range   Lipase 23 11 - 51 U/L  Urinalysis, Routine w reflex microscopic (not at [facility])     Status: Abnormal   Collection Time: 05/19/15  8:55 AM  Result Value Ref Range   Color, Urine YELLOW YELLOW   APPearance HAZY (A) CLEAR   Specific Gravity, Urine >1.030 (H) 1.005 - 1.030   pH 6.0 5.0 - 8.0   Glucose, UA NEGATIVE NEGATIVE mg/dL   Hgb urine dipstick NEGATIVE NEGATIVE   Bilirubin Urine NEGATIVE NEGATIVE   Ketones, ur >80 (A) NEGATIVE mg/dL   Protein, ur 30 (A) NEGATIVE mg/dL   Nitrite NEGATIVE NEGATIVE   Leukocytes, UA NEGATIVE NEGATIVE  Urine microscopic-add on     Status: Abnormal   Collection Time: 05/19/15  8:55 AM  Result Value Ref Range   Squamous Epithelial / LPF TOO NUMEROUS TO COUNT (A) NONE SEEN   WBC, UA 0-5 0 - 5 WBC/hpf   RBC / HPF 0-5 0 - 5 RBC/hpf   Bacteria, UA MANY (A) NONE SEEN   Urine-Other MUCOUS PRESENT   Influenza panel by PCR (type A & B, H1N1)     Status: Abnormal   Collection Time: 05/19/15 11:49 AM  Result Value Ref  Range   Influenza A By PCR NEGATIVE NEGATIVE   Influenza B By PCR POSITIVE (A) NEGATIVE   H1N1 flu by pcr NOT DETECTED NOT DETECTED    MDM Zofran and GI cocktail given Care turned over to E. Purcell Nails, NP [facility]  7:51 AM 05/19/2015   Assessment and Plan   Pt able to provide urine sample.  > 80 of ketones on urine so will start IV fluids with phenergan & IV pepcid as pt reports epigastric pain has returned Ibuprofen 800 mg PO Pt reports improvement in symptoms after receiving IV fluids & meds but has continued "soreness" in upper abdomen that wraps around to back; likely d/t coughing & vomiting.  Flu swab collected  A: 1. Cough   2. Nausea/vomiting in pregnancy     P; Discharge home Rx tessalon & zofran Discussed reasons to return to MAU Keep f/u with OB Flu swab pending  Jorje Guild, NP

## 2015-05-19 NOTE — Telephone Encounter (Signed)
Called patient regarding positive flu test. Rx sent in for tamiflu. Questions answered.

## 2015-05-24 ENCOUNTER — Inpatient Hospital Stay (HOSPITAL_COMMUNITY)
Admission: AD | Admit: 2015-05-24 | Discharge: 2015-05-24 | Disposition: A | Discharge status: Home / Self Care | Payer: Medicaid Other | Source: Ambulatory Visit | Attending: Family Medicine | Admitting: Family Medicine

## 2015-05-24 ENCOUNTER — Other Ambulatory Visit: Payer: Self-pay

## 2015-05-24 ENCOUNTER — Encounter (HOSPITAL_COMMUNITY): Payer: Self-pay

## 2015-05-24 ENCOUNTER — Inpatient Hospital Stay (HOSPITAL_COMMUNITY): Payer: Medicaid Other

## 2015-05-24 DIAGNOSIS — Z3A23 23 weeks gestation of pregnancy: Secondary | ICD-10-CM | POA: Insufficient documentation

## 2015-05-24 DIAGNOSIS — Z87891 Personal history of nicotine dependence: Secondary | ICD-10-CM | POA: Diagnosis not present

## 2015-05-24 DIAGNOSIS — J101 Influenza due to other identified influenza virus with other respiratory manifestations: Secondary | ICD-10-CM | POA: Insufficient documentation

## 2015-05-24 DIAGNOSIS — O212 Late vomiting of pregnancy: Secondary | ICD-10-CM | POA: Insufficient documentation

## 2015-05-24 DIAGNOSIS — E860 Dehydration: Secondary | ICD-10-CM | POA: Diagnosis not present

## 2015-05-24 DIAGNOSIS — R0602 Shortness of breath: Secondary | ICD-10-CM | POA: Diagnosis present

## 2015-05-24 DIAGNOSIS — O219 Vomiting of pregnancy, unspecified: Secondary | ICD-10-CM

## 2015-05-24 LAB — URINALYSIS, ROUTINE W REFLEX MICROSCOPIC
Glucose, UA: NEGATIVE mg/dL
Hgb urine dipstick: NEGATIVE
NITRITE: NEGATIVE
PH: 6.5 (ref 5.0–8.0)
Protein, ur: 30 mg/dL — AB
SPECIFIC GRAVITY, URINE: 1.02 (ref 1.005–1.030)

## 2015-05-24 LAB — URINE MICROSCOPIC-ADD ON

## 2015-05-24 MED ORDER — ONDANSETRON HCL 4 MG/2ML IJ SOLN: 4.0000 mg | Freq: Once | INTRAMUSCULAR | Status: DC

## 2015-05-24 MED ORDER — LACTATED RINGERS IV BOLUS (SEPSIS): 1000.0000 mL | Freq: Once | INTRAVENOUS | Status: DC

## 2015-05-24 MED ORDER — PROMETHAZINE HCL 25 MG PO TABS: 25.0000 mg | ORAL_TABLET | Freq: Four times a day (QID) | ORAL | Status: DC | PRN

## 2015-05-24 MED ORDER — LACTATED RINGERS IV SOLN
25.0000 mg | Freq: Once | INTRAVENOUS | Status: AC
  Administered 2015-05-24: 25 mg via INTRAVENOUS
  Filled 2015-05-24: qty 1

## 2015-05-24 MED ORDER — ONDANSETRON 8 MG PO TBDP: 8.0000 mg | ORAL_TABLET | Freq: Once | ORAL | Status: DC

## 2015-05-24 MED ORDER — DEXTROSE 5 % IN LACTATED RINGERS IV BOLUS
1000.0000 mL | Freq: Once | INTRAVENOUS | Status: AC
  Administered 2015-05-24: 1000 mL via INTRAVENOUS

## 2015-05-24 NOTE — Discharge Instructions (Signed)
Dehydration, Adult Dehydration is a condition in which you do not have enough fluid or water in your body. It happens when you take in less fluid than you lose. Vital organs such as the kidneys, brain, and heart cannot function without a proper amount of fluids. Any loss of fluids from the body can cause dehydration.  Dehydration can range from mild to severe. This condition should be treated right away to help prevent it from becoming severe. CAUSES  This condition may be caused by:  Vomiting.  Diarrhea.  Excessive sweating, such as when exercising in hot or humid weather.  Not drinking enough fluid during strenuous exercise or during an illness.  Excessive urine output.  Fever.  Certain medicines. RISK FACTORS This condition is more likely to develop in:  People who are taking certain medicines that cause the body to lose excess fluid (diuretics).   People who have a chronic illness, such as diabetes, that may increase urination.  Older adults.   People who live at high altitudes.   People who participate in endurance sports.  SYMPTOMS  Mild Dehydration  Thirst.  Dry lips.  Slightly dry mouth.  Dry, warm skin. Moderate Dehydration  Very dry mouth.   Muscle cramps.   Dark urine and decreased urine production.   Decreased tear production.   Headache.   Light-headedness, especially when you stand up from a sitting position.  Severe Dehydration  Changes in skin.   Cold and clammy skin.   Skin does not spring back quickly when lightly pinched and released.   Changes in body fluids.   Extreme thirst.   No tears.   Not able to sweat when body temperature is high, such as in hot weather.   Minimal urine production.   Changes in vital signs.   Rapid, weak pulse (more than 100 beats per minute when you are sitting still).   Rapid breathing.   Low blood pressure.   Other changes.   Sunken eyes.   Cold hands and feet.    Confusion.  Lethargy and difficulty being awakened.  Fainting (syncope).   Short-term weight loss.   Unconsciousness. DIAGNOSIS  This condition may be diagnosed based on your symptoms. You may also have tests to determine how severe your dehydration is. These tests may include:   Urine tests.   Blood tests.  TREATMENT  Treatment for this condition depends on the severity. Mild or moderate dehydration can often be treated at home. Treatment should be started right away. Do not wait until dehydration becomes severe. Severe dehydration needs to be treated at the hospital. Treatment for Mild Dehydration  Drinking plenty of water to replace the fluid you have lost.   Replacing minerals in your blood (electrolytes) that you may have lost.  Treatment for Moderate Dehydration  Consuming oral rehydration solution (ORS). Treatment for Severe Dehydration  Receiving fluid through an IV tube.   Receiving electrolyte solution through a feeding tube that is passed through your nose and into your stomach (nasogastric tube or NG tube).  Correcting any abnormalities in electrolytes. HOME CARE INSTRUCTIONS   Drink enough fluid to keep your urine clear or pale yellow.   Drink water or fluid slowly by taking small sips. You can also try sucking on ice cubes.  Have food or beverages that contain electrolytes. Examples include bananas and sports drinks.  Take over-the-counter and prescription medicines only as told by your health care provider.   Prepare ORS according to the manufacturer's instructions. Take sips   of ORS every 5 minutes until your urine returns to normal.  If you have vomiting or diarrhea, continue to try to drink water, ORS, or both.   If you have diarrhea, avoid:   Beverages that contain caffeine.   Fruit juice.   Milk.   Carbonated soft drinks.  Do not take salt tablets. This can lead to the condition of having too much sodium in your body  (hypernatremia).  SEEK MEDICAL CARE IF:  You cannot eat or drink without vomiting.  You have had moderate diarrhea during a period of more than 24 hours.  You have a fever. SEEK IMMEDIATE MEDICAL CARE IF:   You have extreme thirst.  You have severe diarrhea.  You have not urinated in 6-8 hours, or you have urinated only a small amount of very dark urine.  You have shriveled skin.  You are dizzy, confused, or both.   This information is not intended to replace advice given to you by your health care provider. Make sure you discuss any questions you have with your health care provider.   Document Released: 01/16/2005 Document Revised: 10/07/2014 Document Reviewed: 06/03/2014 Elsevier Interactive Patient Education 2016 Else[employer] Inc[employer]  Influenza, Adult Influenza ("the flu") is a viral infection of the respiratory tract. It occurs more often in winter months because people spend more time in close contact with one another. Influenza can make you feel very sick. Influenza easily spreads from person to person (contagious). CAUSES  Influenza is caused by a virus that infects the respiratory tract. You can catch the virus by breathing in droplets from an infected person's cough or sneeze. You can also catch the virus by touching something that was recently contaminated with the virus and then touching your mouth, nose, or eyes. RISKS AND COMPLICATIONS You may be at risk for a more severe case of influenza if you smoke cigarettes, have diabetes, have chronic heart disease (such as heart failure) or lung disease (such as asthma), or if you have a weakened immune system. Elderly people and pregnant women are also at risk for more serious infections. The most common problem of influenza is a lung infection (pneumonia). Sometimes, this problem can require emergency medical care and may be life threatening. SIGNS AND SYMPTOMS  Symptoms typically last 4 to 10 days and may  include:  Fever.  Chills.  Headache, body aches, and muscle aches.  Sore throat.  Chest discomfort and cough.  Poor appetite.  Weakness or feeling tired.  Dizziness.  Nausea or vomiting. DIAGNOSIS  Diagnosis of influenza is often made based on your history and a physical exam. A nose or throat swab test can be done to confirm the diagnosis. TREATMENT  In mild cases, influenza goes away on its own. Treatment is directed at relieving symptoms. For more severe cases, your health care provider may prescribe antiviral medicines to shorten the sickness. Antibiotic medicines are not effective because the infection is caused by a virus, not by bacteria. HOME CARE INSTRUCTIONS  Take medicines only as directed by your health care provider.  Use a cool mist humidifier to make breathing easier.  Get plenty of rest until your temperature returns to normal. This usually takes 3 to 4 days.  Drink enough fluid to keep your urine clear or pale yellow.  Cover yourmouth and nosewhen coughing or sneezing,and wash your handswellto prevent thevirusfrom spreading.  Stay homefromwork orschool untilthe fever is gonefor at least 1full day. PREVENTION  An annual influenza vaccination (flu shot) is the best   way to avoid getting influenza. An annual flu shot is now routinely recommended for all adults in the [place] SEEK MEDICAL CARE IF:  You experiencechest pain, yourcough worsens,or you producemore mucus.  Youhave nausea,vomiting, ordiarrhea.  Your fever returns or gets worse. SEEK IMMEDIATE MEDICAL CARE IF:  You havetrouble breathing, you become short of breath,or your skin ornails becomebluish.  You have severe painor stiffnessin the neck.  You develop a sudden headache, or pain in the face or ear.  You have nausea or vomiting that you cannot control. MAKE SURE YOU:   Understand these instructions.  Will watch your condition.  Will get help right away if you  are not doing well or get worse.   This information is not intended to replace advice given to you by your health care provider. Make sure you discuss any questions you have with your health care provider.   Document Released: 01/14/2000 Document Revised: 02/06/2014 Document Reviewed: 04/17/2011 Elsevier Interactive Patient Education 2016 Elsevier Inc.  

## 2015-05-24 NOTE — MAU Provider Note (Signed)
History   G2P1 @ 23 wks in with cough, SOB, chest pain, and vomiting for two days. Pos for Flu B on 05/19/15  CSN: [id number]  Arrival date & time 05/24/15  1740   None     Chief Complaint  Patient presents with  . Shortness of Breath  . Emesis    HPI  Past Medical History  Diagnosis Date  . Medical history non-contributory     Past Surgical History  Procedure Laterality Date  . No past surgeries      Family History  Problem Relation Age of Onset  . Cancer Maternal Grandmother   . Cancer Maternal Grandfather     Social History  Substance Use Topics  . Smoking status: Former Smoker -- 0.20 packs/day    Types: Cigarettes  . Smokeless tobacco: Never Used  . Alcohol Use: Yes     Comment: weekly    OB History    Gravida Para Term Preterm AB TAB SAB Ectopic Multiple Living   2 1 1       1       Review of Systems  Constitutional: Negative.   HENT: Positive for congestion.   Eyes: Negative.   Respiratory: Positive for cough, chest tightness and shortness of breath.   Cardiovascular: Negative.   Gastrointestinal: Positive for vomiting.  Endocrine: Negative.   Genitourinary: Negative.   Musculoskeletal: Positive for myalgias.  Skin: Negative.   Allergic/Immunologic: Negative.   Neurological: Negative.   Hematological: Negative.   Psychiatric/Behavioral: Negative.     Allergies  Peanuts  Home Medications  No current outpatient prescriptions on file.  BP 120/82 mmHg  Pulse 84  Temp(Src) 97.4 F (36.3 C) (Axillary)  Resp 24  Ht 5\' 2"  (1.575 m)  SpO2 97%  LMP 11/21/2014  Physical Exam  Constitutional: She is oriented to person, place, and time. She appears well-developed and well-nourished.  HENT:  Head: Normocephalic.  Eyes: Pupils are equal, round, and reactive to light.  Neck: Normal range of motion.  Cardiovascular: Normal rate, regular rhythm, normal heart sounds and intact distal pulses.   Pulmonary/Chest:  tachypnea  Abdominal: Soft.  Bowel sounds are normal.  Musculoskeletal: Normal range of motion.  Neurological: She is alert and oriented to person, place, and time. She has normal reflexes.  Skin: Skin is warm and dry.  Psychiatric: She has a normal mood and affect. Her behavior is normal. Judgment and thought content normal.    MAU Course  Procedures (including critical care time)  Labs Reviewed  URINALYSIS, [place] (NOT AT [facility])   No results found.   No diagnosis found.    MDM  EKG normal, O2 sat WNL, CXR normal. Greater than 80 ketones in urine will iv hydrate.      Care assumed from [place] at 2100 -IV hydration with phenergan in process -Pt afebrile & pulse ox remains 96-100% -Patient has not vomited since being in MAU & has received 2 bags of IV fluids -Needs NST then can be discharged home  A: 1. Dehydration   2. Vomiting pregnancy   3. Influenza B     P: Discharge home after fetal monitoring Rx phenergan Advance diet as tolerated

## 2015-05-24 NOTE — MAU Note (Signed)
SOB with any exertion, started last week.  Has not kept anything down since 2 days before she was last seen,  Was told it was the flu, has taken all the meds.

## 2015-05-26 LAB — CULTURE, OB URINE

## 2015-07-05 ENCOUNTER — Encounter (HOSPITAL_COMMUNITY): Payer: Self-pay | Admitting: Obstetrics & Gynecology

## 2015-08-23 ENCOUNTER — Other Ambulatory Visit (HOSPITAL_COMMUNITY): Payer: Self-pay | Admitting: Obstetrics & Gynecology

## 2015-08-23 DIAGNOSIS — D1803 Hemangioma of intra-abdominal structures: Secondary | ICD-10-CM

## 2015-09-27 ENCOUNTER — Inpatient Hospital Stay (HOSPITAL_COMMUNITY)
Admission: AD | Admit: 2015-09-27 | Discharge: 2015-09-30 | DRG: 766 | Disposition: A | Discharge status: Home / Self Care | Payer: Medicaid Other | Source: Ambulatory Visit | Attending: Family Medicine | Admitting: Family Medicine

## 2015-09-27 ENCOUNTER — Encounter (HOSPITAL_COMMUNITY): Payer: Self-pay | Admitting: *Deleted

## 2015-09-27 DIAGNOSIS — Z3A41 41 weeks gestation of pregnancy: Secondary | ICD-10-CM

## 2015-09-27 DIAGNOSIS — O99824 Streptococcus B carrier state complicating childbirth: Secondary | ICD-10-CM | POA: Diagnosis present

## 2015-09-27 DIAGNOSIS — F17210 Nicotine dependence, cigarettes, uncomplicated: Secondary | ICD-10-CM | POA: Diagnosis present

## 2015-09-27 DIAGNOSIS — D1803 Hemangioma of intra-abdominal structures: Secondary | ICD-10-CM | POA: Diagnosis present

## 2015-09-27 DIAGNOSIS — Z302 Encounter for sterilization: Secondary | ICD-10-CM | POA: Diagnosis not present

## 2015-09-27 DIAGNOSIS — O480 Post-term pregnancy: Secondary | ICD-10-CM | POA: Diagnosis present

## 2015-09-27 DIAGNOSIS — O99334 Smoking (tobacco) complicating childbirth: Secondary | ICD-10-CM | POA: Diagnosis present

## 2015-09-27 HISTORY — DX: Contact with and (suspected) exposure to infections with a predominantly sexual mode of transmission: Z20.2

## 2015-09-27 HISTORY — DX: Hemangioma of intra-abdominal structures: D18.03

## 2015-09-27 LAB — TYPE AND SCREEN
ABO/RH(D): A POS
Antibody Screen: NEGATIVE

## 2015-09-27 LAB — OB RESULTS CONSOLE HIV ANTIBODY (ROUTINE TESTING): HIV: NONREACTIVE

## 2015-09-27 LAB — OB RESULTS CONSOLE HEPATITIS B SURFACE ANTIGEN: Hepatitis B Surface Ag: NEGATIVE

## 2015-09-27 LAB — CBC
HEMATOCRIT: 34.4 % — AB (ref 36.0–46.0)
HEMOGLOBIN: 11.7 g/dL — AB (ref 12.0–15.0)
MCH: 29 pg (ref 26.0–34.0)
MCHC: 34 g/dL (ref 30.0–36.0)
MCV: 85.1 fL (ref 78.0–100.0)
Platelets: 345 10*3/uL (ref 150–400)
RBC: 4.04 MIL/uL (ref 3.87–5.11)
RDW: 13.8 % (ref 11.5–15.5)
WBC: 14.1 10*3/uL — ABNORMAL HIGH (ref 4.0–10.5)

## 2015-09-27 LAB — OB RESULTS CONSOLE ABO/RH: RH TYPE: POSITIVE

## 2015-09-27 LAB — OB RESULTS CONSOLE GC/CHLAMYDIA
CHLAMYDIA, DNA PROBE: NEGATIVE
Gonorrhea: NEGATIVE

## 2015-09-27 LAB — OB RESULTS CONSOLE RUBELLA ANTIBODY, IGM: RUBELLA: IMMUNE

## 2015-09-27 LAB — ABO/RH: ABO/RH(D): A POS

## 2015-09-27 LAB — OB RESULTS CONSOLE ANTIBODY SCREEN: ANTIBODY SCREEN: NEGATIVE

## 2015-09-27 LAB — OB RESULTS CONSOLE GBS: STREP GROUP B AG: POSITIVE

## 2015-09-27 LAB — OB RESULTS CONSOLE RPR: RPR: NONREACTIVE

## 2015-09-27 MED ORDER — OXYTOCIN 40 UNITS IN LACTATED RINGERS INFUSION - SIMPLE MED: 2.5000 [IU]/h | INTRAVENOUS | Status: DC

## 2015-09-27 MED ORDER — OXYCODONE-ACETAMINOPHEN 5-325 MG PO TABS: 2.0000 | ORAL_TABLET | ORAL | Status: DC | PRN

## 2015-09-27 MED ORDER — ONDANSETRON HCL 4 MG/2ML IJ SOLN: 4.0000 mg | Freq: Four times a day (QID) | INTRAMUSCULAR | Status: DC | PRN

## 2015-09-27 MED ORDER — SOD CITRATE-CITRIC ACID 500-334 MG/5ML PO SOLN
30.0000 mL | ORAL | Status: DC | PRN
  Filled 2015-09-27: qty 15

## 2015-09-27 MED ORDER — LACTATED RINGERS IV SOLN
INTRAVENOUS | Status: DC
  Administered 2015-09-27 – 2015-09-28 (×4): via INTRAVENOUS

## 2015-09-27 MED ORDER — OXYTOCIN BOLUS FROM INFUSION: 500.0000 mL | Freq: Once | INTRAVENOUS | Status: DC

## 2015-09-27 MED ORDER — LACTATED RINGERS IV SOLN
500.0000 mL | INTRAVENOUS | Status: DC | PRN
  Administered 2015-09-28: 250 mL via INTRAVENOUS

## 2015-09-27 MED ORDER — OXYCODONE-ACETAMINOPHEN 5-325 MG PO TABS: 1.0000 | ORAL_TABLET | ORAL | Status: DC | PRN

## 2015-09-27 MED ORDER — PENICILLIN G POTASSIUM 5000000 UNITS IJ SOLR
2.5000 10*6.[IU] | INTRAVENOUS | Status: DC
  Administered 2015-09-27 – 2015-09-28 (×4): 2.5 10*6.[IU] via INTRAVENOUS
  Filled 2015-09-27 (×6): qty 2.5

## 2015-09-27 MED ORDER — PENICILLIN G POTASSIUM 5000000 UNITS IJ SOLR
5.0000 10*6.[IU] | Freq: Once | INTRAVENOUS | Status: AC
  Administered 2015-09-27: 5 10*6.[IU] via INTRAVENOUS
  Filled 2015-09-27: qty 5

## 2015-09-27 MED ORDER — ACETAMINOPHEN 325 MG PO TABS: 650.0000 mg | ORAL_TABLET | ORAL | Status: DC | PRN

## 2015-09-27 MED ORDER — MISOPROSTOL 25 MCG QUARTER TABLET
25.0000 ug | ORAL_TABLET | ORAL | Status: DC | PRN
  Administered 2015-09-27 (×2): 25 ug via VAGINAL
  Filled 2015-09-27 (×2): qty 0.25

## 2015-09-27 MED ORDER — FLEET ENEMA 7-19 GM/118ML RE ENEM: 1.0000 | ENEMA | RECTAL | Status: DC | PRN

## 2015-09-27 MED ORDER — LIDOCAINE HCL (PF) 1 % IJ SOLN: 30.0000 mL | INTRAMUSCULAR | Status: DC | PRN

## 2015-09-27 MED ORDER — TERBUTALINE SULFATE 1 MG/ML IJ SOLN: 0.2500 mg | Freq: Once | INTRAMUSCULAR | Status: DC | PRN

## 2015-09-27 NOTE — H&P (Signed)
Lindsay Medina is a 25 y.o. female presenting for IOL PD. Pt was seen at the health department today and had some variables on the monitor.   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  OB History    Gravida Para Term Preterm AB Living   2 1 1     1    SAB TAB Ectopic Multiple Live Births                 Past Medical History:  Diagnosis Date  . Hepatic hemangioma   . Medical history non-contributory   . Trichomonas contact, treated    Past Surgical History:  Procedure Laterality Date  . NO PAST SURGERIES     Family History: family history includes Cancer in her maternal grandfather and maternal grandmother. Social History:  reports that she has been smoking Cigarettes.  She has been smoking about 0.20 packs per day. She has never used smokeless tobacco. She reports that she drinks alcohol. She reports that she uses drugs, including Marijuana.     Maternal Diabetes: No Genetic Screening: Normal Maternal Ultrasounds/Referrals: Normal Fetal Ultrasounds or other Referrals:  None Maternal Substance Abuse:  No Significant Maternal Medications:  None Significant Maternal Lab Results:  GBS + Other Comments:  None  Review of Systems  Constitutional: Negative.   HENT: Negative.   Eyes: Negative.   Respiratory: Negative.   Cardiovascular: Negative.   Gastrointestinal: Negative.   Genitourinary: Negative.   Musculoskeletal: Negative.   Skin: Negative.   Neurological: Negative.   Endo/Heme/Allergies: Negative.   Psychiatric/Behavioral: Negative.    Maternal Medical History:  Fetal activity: Perceived fetal activity is normal.   Last perceived fetal movement was within the past hour.    Prenatal complications: no prenatal complications Prenatal Complications - Diabetes: none.      Last menstrual period 11/21/2014. Exam Physical Exam  Vitals reviewed. Constitutional: She is oriented to person, place, and time. She appears  well-developed and well-nourished. No distress.  Respiratory: Effort normal.  Neurological: She is alert and oriented to person, place, and time.  Skin: Skin is warm. No rash noted. No erythema.  Psychiatric: She has a normal mood and affect.   NST: 140/mod var/reactive  Prenatal labs: ABO, Rh: A/Positive/-- (08/28 1221) Antibody: Negative (08/28 1221) Rubella: Immune (08/28 1221) RPR: Nonreactive (08/28 1221)  HBsAg: Negative (08/28 1221)  HIV: Non-reactive (08/28 1221)  GBS: Positive (08/28 1221)   Assessment/Plan:  25 y.o. female presenting for IOL PD G2P1 @41wks  also with non-reassuring fetal heat tracing in office today at the health department.   #Labor: Plan for cytotec induction with transition to foley #Pain: desires  #MOC: BTL consent signed 07/05/2015 #FWB: Category 1

## 2015-09-27 NOTE — Progress Notes (Signed)
Labor Progress Note YUVIA KIROUAC is a 25 y.o. G2P1001 at [redacted]w[redacted]d presented for IOL PD. Pt was seen at the health department today and had some variables on the monitor.   S: Patient states she is breathing through contractions at this point. She feels contractions every 2-3 minutes   O:  BP 119/74   Pulse 79   Temp 98 F (36.7 C) (Oral)   Resp 18   Ht 5\' 2"  (1.575 m)   Wt 88.9 kg (196 lb)   LMP 11/21/2014   BMI 35.85 kg/m  EFM: 150/moderate/accels +   CVE: Dilation: Fingertip Effacement (%): Thick Station: -3 Presentation: Vertex Exam by:: Zakariya Knickerbocker, MD   A&P: 25 y.o. G2P1001 [redacted]w[redacted]d here for IOL 2/2 postdates   #Labor: Cytotec, attempted foley bulb without success  #Pain: Desires Epidural in the future, no pain medication for now  #FWB:Catergory 1  #GBS negative   Makhya Arave Criss Rosales, MD 9:07 PM

## 2015-09-27 NOTE — Anesthesia Pain Management Evaluation Note (Signed)
  [facility] Pain Management Visit Note  Patient: Lindsay Medina, 25 y.o., female  "Hello I am a member of the anesthesia team at [facility]. We have an anesthesia team available at all times to provide care throughout the hospital, including epidural management and anesthesia for C-section. I don't know your plan for the delivery whether it a natural birth, water birth, IV sedation, nitrous supplementation, doula or epidural, but we want to meet your pain goals."   1.Was your pain managed to your expectations on prior hospitalizations?   Yes   2.What is your expectation for pain management during this hospitalization?     Labor support without medications  3.How can we help you reach that goal? Epidural if  needed  Record the patient's initial score and the patient's pain goal.   Pain: 0  Pain Goal: 8 The [facility] wants you to be able to say your pain was always managed very well.  Willa Rough 09/27/2015

## 2015-09-27 NOTE — Progress Notes (Signed)
Lindsay Medina is a 25 y.o. female presenting for IOL PD. OB History    Gravida Para Term Preterm AB Living   2 1 1     1    SAB TAB Ectopic Multiple Live Births                 Past Medical History:  Diagnosis Date  . Hepatic hemangioma   . Medical history non-contributory   . Trichomonas contact, treated    Past Surgical History:  Procedure Laterality Date  . NO PAST SURGERIES     Family History: family history includes Cancer in her maternal grandfather and maternal grandmother. Social History:  reports that she has been smoking Cigarettes.  She has been smoking about 0.20 packs per day. She has never used smokeless tobacco. She reports that she drinks alcohol. She reports that she uses drugs, including Marijuana.     Maternal Diabetes: No Genetic Screening: Normal Maternal Ultrasounds/Referrals: Normal Fetal Ultrasounds or other Referrals:  None Maternal Substance Abuse:  No Significant Maternal Medications:  None Significant Maternal Lab Results:  None Other Comments:  None  Review of Systems  Constitutional: Negative.   HENT: Negative.   Eyes: Negative.   Respiratory: Negative.   Cardiovascular: Negative.   Gastrointestinal: Negative.   Genitourinary: Negative.   Musculoskeletal: Negative.   Skin: Negative.   Neurological: Negative.   Endo/Heme/Allergies: Negative.   Psychiatric/Behavioral: Negative.    Maternal Medical History:  Fetal activity: Perceived fetal activity is normal.   Last perceived fetal movement was within the past hour.    Prenatal complications: no prenatal complications Prenatal Complications - Diabetes: none.      Last menstrual period 11/21/2014. Exam Physical Exam  Vitals reviewed. Constitutional: She is oriented to person, place, and time. She appears well-developed and well-nourished. No distress.  Respiratory: Effort normal.  Neurological: She is alert and oriented to person, place, and time.  Skin: Skin is warm. No  rash noted. No erythema.  Psychiatric: She has a normal mood and affect.    Prenatal labs: ABO, Rh: A/Positive/-- (08/28 1221) Antibody: Negative (08/28 1221) Rubella: Immune (08/28 1221) RPR: Nonreactive (08/28 1221)  HBsAg: Negative (08/28 1221)  HIV: Non-reactive (08/28 1221)  GBS: Positive (08/28 1221)   Assessment/Plan: Labor induction. GBS Positive will tx.   Demetrios Isaacs 09/27/2015, 12:48 PM   OB FELLOW MEDICAL STUDENT NOTE ATTESTATION  I have seen and examined this patient. Note this is a Careers information officer note and as such does not necessarily reflect the patient's plan of care. Please see Jacquiline Doe MD's note for this date of service.    Jacquiline Doe 09/27/2015, 4:44 PM

## 2015-09-27 NOTE — Progress Notes (Signed)
Labor Progress Note Lindsay Medina is a 25 y.o. G2P1001 at [redacted]w[redacted]d presented for IOL PD. Pt was seen at the health department today and had some variables on the monitor.   S: Continues to have discomfort with contractions. Ok with foley bulb   O:  BP 96/61   Pulse 83   Temp 98.4 F (36.9 C) (Oral)   Resp 18   Ht 5\' 2"  (1.575 m)   Wt 88.9 kg (196 lb)   LMP 11/21/2014   BMI 35.85 kg/m  EFM: 150/moderate/accels +   CVE: Dilation: 1.5 Effacement (%): 50 Station: -3 Presentation: Vertex Exam by:: Dr. Emmaline Life   A&P: 25 y.o. G2P1001 [redacted]w[redacted]d here for IOL 2/2 postdates   #Labor: Foley Bulb for now.  #Pain: Nitrous gas for pain, possibly epidural in the future  #FWB:Catergory 1 currently, previously category 2 around 10:20 PM  #GBS negative   [name], MD 11:29 PM

## 2015-09-28 ENCOUNTER — Encounter (HOSPITAL_COMMUNITY)
Admission: AD | Disposition: A | Discharge status: Home / Self Care | Payer: Self-pay | Source: Ambulatory Visit | Attending: Family Medicine

## 2015-09-28 ENCOUNTER — Inpatient Hospital Stay (HOSPITAL_COMMUNITY): Payer: Medicaid Other | Admitting: Anesthesiology

## 2015-09-28 ENCOUNTER — Encounter (HOSPITAL_COMMUNITY): Payer: Self-pay | Admitting: Anesthesiology

## 2015-09-28 DIAGNOSIS — D1803 Hemangioma of intra-abdominal structures: Secondary | ICD-10-CM

## 2015-09-28 DIAGNOSIS — O99824 Streptococcus B carrier state complicating childbirth: Secondary | ICD-10-CM

## 2015-09-28 DIAGNOSIS — Z302 Encounter for sterilization: Secondary | ICD-10-CM

## 2015-09-28 DIAGNOSIS — Z3A41 41 weeks gestation of pregnancy: Secondary | ICD-10-CM

## 2015-09-28 DIAGNOSIS — O480 Post-term pregnancy: Secondary | ICD-10-CM

## 2015-09-28 DIAGNOSIS — O99334 Smoking (tobacco) complicating childbirth: Secondary | ICD-10-CM

## 2015-09-28 HISTORY — PX: TUBAL LIGATION: SHX77

## 2015-09-28 LAB — RPR: RPR: NONREACTIVE

## 2015-09-28 SURGERY — LIGATION, FALLOPIAN TUBE, BILATERAL
Anesthesia: Spinal | Site: Abdomen

## 2015-09-28 MED ORDER — MORPHINE SULFATE-NACL 0.5-0.9 MG/ML-% IV SOSY
PREFILLED_SYRINGE | INTRAVENOUS | Status: AC
  Filled 2015-09-28: qty 1

## 2015-09-28 MED ORDER — DEXAMETHASONE SODIUM PHOSPHATE 4 MG/ML IJ SOLN
INTRAMUSCULAR | Status: AC
  Filled 2015-09-28: qty 1

## 2015-09-28 MED ORDER — OXYTOCIN 10 UNIT/ML IJ SOLN
INTRAMUSCULAR | Status: AC
  Filled 2015-09-28: qty 4

## 2015-09-28 MED ORDER — OXYTOCIN 40 UNITS IN LACTATED RINGERS INFUSION - SIMPLE MED: 2.5000 [IU]/h | INTRAVENOUS | Status: AC

## 2015-09-28 MED ORDER — FENTANYL CITRATE (PF) 100 MCG/2ML IJ SOLN
INTRAMUSCULAR | Status: AC
  Filled 2015-09-28: qty 2

## 2015-09-28 MED ORDER — MENTHOL 3 MG MT LOZG: 1.0000 | LOZENGE | OROMUCOSAL | Status: DC | PRN

## 2015-09-28 MED ORDER — KETOROLAC TROMETHAMINE 30 MG/ML IJ SOLN: 30.0000 mg | Freq: Four times a day (QID) | INTRAMUSCULAR | Status: DC | PRN

## 2015-09-28 MED ORDER — ONDANSETRON HCL 4 MG/2ML IJ SOLN
INTRAMUSCULAR | Status: DC | PRN
  Administered 2015-09-28: 4 mg via INTRAVENOUS

## 2015-09-28 MED ORDER — ACETAMINOPHEN 325 MG PO TABS
650.0000 mg | ORAL_TABLET | ORAL | Status: DC | PRN
  Administered 2015-09-30: 650 mg via ORAL
  Filled 2015-09-28: qty 2

## 2015-09-28 MED ORDER — SOD CITRATE-CITRIC ACID 500-334 MG/5ML PO SOLN
30.0000 mL | ORAL | Status: AC
  Administered 2015-09-28: 30 mL via ORAL

## 2015-09-28 MED ORDER — MORPHINE SULFATE (PF) 0.5 MG/ML IJ SOLN
INTRAMUSCULAR | Status: DC | PRN
  Administered 2015-09-28: .2 mg via INTRATHECAL

## 2015-09-28 MED ORDER — SODIUM CHLORIDE 0.9 % IR SOLN
Status: DC | PRN
  Administered 2015-09-28: 1

## 2015-09-28 MED ORDER — EPHEDRINE 5 MG/ML INJ: 10.0000 mg | INTRAVENOUS | Status: DC | PRN

## 2015-09-28 MED ORDER — COCONUT OIL OIL: 1.0000 "application " | TOPICAL_OIL | Status: DC | PRN

## 2015-09-28 MED ORDER — SCOPOLAMINE 1 MG/3DAYS TD PT72
MEDICATED_PATCH | TRANSDERMAL | Status: AC
  Filled 2015-09-28: qty 1

## 2015-09-28 MED ORDER — NALBUPHINE HCL 10 MG/ML IJ SOLN
5.0000 mg | Freq: Once | INTRAMUSCULAR | Status: AC | PRN
  Administered 2015-09-28: 5 mg via SUBCUTANEOUS

## 2015-09-28 MED ORDER — NALOXONE HCL 0.4 MG/ML IJ SOLN: 0.4000 mg | INTRAMUSCULAR | Status: DC | PRN

## 2015-09-28 MED ORDER — OXYCODONE HCL 5 MG PO TABS
10.0000 mg | ORAL_TABLET | ORAL | Status: DC | PRN
  Administered 2015-09-29 – 2015-09-30 (×3): 10 mg via ORAL
  Filled 2015-09-28 (×3): qty 2

## 2015-09-28 MED ORDER — CEFAZOLIN SODIUM-DEXTROSE 2-4 GM/100ML-% IV SOLN
2.0000 g | INTRAVENOUS | Status: AC
  Administered 2015-09-28: 2 g via INTRAVENOUS

## 2015-09-28 MED ORDER — PHENYLEPHRINE 40 MCG/ML (10ML) SYRINGE FOR IV PUSH (FOR BLOOD PRESSURE SUPPORT): 80.0000 ug | PREFILLED_SYRINGE | INTRAVENOUS | Status: DC | PRN

## 2015-09-28 MED ORDER — SENNOSIDES-DOCUSATE SODIUM 8.6-50 MG PO TABS
2.0000 | ORAL_TABLET | ORAL | Status: DC
  Administered 2015-09-30: 2 via ORAL
  Filled 2015-09-28 (×2): qty 2

## 2015-09-28 MED ORDER — ONDANSETRON HCL 4 MG/2ML IJ SOLN
INTRAMUSCULAR | Status: AC
  Filled 2015-09-28: qty 2

## 2015-09-28 MED ORDER — NALBUPHINE HCL 10 MG/ML IJ SOLN
INTRAMUSCULAR | Status: AC
  Filled 2015-09-28: qty 1

## 2015-09-28 MED ORDER — SCOPOLAMINE 1 MG/3DAYS TD PT72
MEDICATED_PATCH | TRANSDERMAL | Status: DC | PRN
  Administered 2015-09-28: 1 via TRANSDERMAL

## 2015-09-28 MED ORDER — MEPERIDINE HCL 25 MG/ML IJ SOLN
6.2500 mg | INTRAMUSCULAR | Status: DC | PRN
Start: 2015-09-28 — End: 2015-09-28

## 2015-09-28 MED ORDER — SIMETHICONE 80 MG PO CHEW
80.0000 mg | CHEWABLE_TABLET | Freq: Three times a day (TID) | ORAL | Status: DC
  Administered 2015-09-29 – 2015-09-30 (×3): 80 mg via ORAL
  Filled 2015-09-28 (×4): qty 1

## 2015-09-28 MED ORDER — DEXTROSE 5 % IV SOLN
1.0000 ug/kg/h | INTRAVENOUS | Status: DC | PRN
  Filled 2015-09-28: qty 2

## 2015-09-28 MED ORDER — NALBUPHINE HCL 10 MG/ML IJ SOLN: 5.0000 mg | Freq: Once | INTRAMUSCULAR | Status: AC | PRN

## 2015-09-28 MED ORDER — DIPHENHYDRAMINE HCL 50 MG/ML IJ SOLN: 12.5000 mg | INTRAMUSCULAR | Status: DC | PRN

## 2015-09-28 MED ORDER — ONDANSETRON HCL 4 MG/2ML IJ SOLN: 4.0000 mg | Freq: Three times a day (TID) | INTRAMUSCULAR | Status: DC | PRN

## 2015-09-28 MED ORDER — NALBUPHINE HCL 10 MG/ML IJ SOLN: 5.0000 mg | INTRAMUSCULAR | Status: DC | PRN

## 2015-09-28 MED ORDER — SIMETHICONE 80 MG PO CHEW: 80.0000 mg | CHEWABLE_TABLET | ORAL | Status: DC | PRN

## 2015-09-28 MED ORDER — LACTATED RINGERS IV SOLN: INTRAVENOUS | Status: DC

## 2015-09-28 MED ORDER — FENTANYL CITRATE (PF) 100 MCG/2ML IJ SOLN
100.0000 ug | INTRAMUSCULAR | Status: DC | PRN
  Administered 2015-09-28 (×4): 100 ug via INTRAVENOUS
  Filled 2015-09-28 (×4): qty 2

## 2015-09-28 MED ORDER — ZOLPIDEM TARTRATE 5 MG PO TABS: 5.0000 mg | ORAL_TABLET | Freq: Every evening | ORAL | Status: DC | PRN

## 2015-09-28 MED ORDER — DIPHENHYDRAMINE HCL 25 MG PO CAPS: 25.0000 mg | ORAL_CAPSULE | Freq: Four times a day (QID) | ORAL | Status: DC | PRN

## 2015-09-28 MED ORDER — LACTATED RINGERS IV SOLN
INTRAVENOUS | Status: DC | PRN
  Administered 2015-09-28: 40 [IU] via INTRAVENOUS

## 2015-09-28 MED ORDER — WITCH HAZEL-GLYCERIN EX PADS: 1.0000 "application " | MEDICATED_PAD | CUTANEOUS | Status: DC | PRN

## 2015-09-28 MED ORDER — KETOROLAC TROMETHAMINE 30 MG/ML IJ SOLN
INTRAMUSCULAR | Status: AC
  Filled 2015-09-28: qty 1

## 2015-09-28 MED ORDER — HYDROMORPHONE HCL 1 MG/ML IJ SOLN
0.5000 mg | INTRAMUSCULAR | Status: DC | PRN
Start: 2015-09-28 — End: 2015-09-30
  Administered 2015-09-28: 0.5 mg via INTRAVENOUS
  Filled 2015-09-28: qty 1

## 2015-09-28 MED ORDER — TERBUTALINE SULFATE 1 MG/ML IJ SOLN: 0.2500 mg | Freq: Once | INTRAMUSCULAR | Status: DC | PRN

## 2015-09-28 MED ORDER — OXYTOCIN 40 UNITS IN LACTATED RINGERS INFUSION - SIMPLE MED
1.0000 m[IU]/min | INTRAVENOUS | Status: DC
  Administered 2015-09-28: 2 m[IU]/min via INTRAVENOUS
  Filled 2015-09-28: qty 1000

## 2015-09-28 MED ORDER — PRENATAL MULTIVITAMIN CH
1.0000 | ORAL_TABLET | Freq: Every day | ORAL | Status: DC
  Administered 2015-09-29 – 2015-09-30 (×2): 1 via ORAL
  Filled 2015-09-28 (×2): qty 1

## 2015-09-28 MED ORDER — SODIUM CHLORIDE 0.9% FLUSH: 3.0000 mL | INTRAVENOUS | Status: DC | PRN

## 2015-09-28 MED ORDER — DEXAMETHASONE SODIUM PHOSPHATE 4 MG/ML IJ SOLN
INTRAMUSCULAR | Status: DC | PRN
  Administered 2015-09-28: 4 mg via INTRAVENOUS

## 2015-09-28 MED ORDER — BUPIVACAINE IN DEXTROSE 0.75-8.25 % IT SOLN
INTRATHECAL | Status: DC | PRN
  Administered 2015-09-28: 1.8 mL via INTRATHECAL

## 2015-09-28 MED ORDER — PHENYLEPHRINE 8 MG IN D5W 100 ML (0.08MG/ML) PREMIX OPTIME
INJECTION | INTRAVENOUS | Status: DC | PRN
  Administered 2015-09-28: 60 ug/min via INTRAVENOUS

## 2015-09-28 MED ORDER — TETANUS-DIPHTH-ACELL PERTUSSIS 5-2.5-18.5 LF-MCG/0.5 IM SUSP: 0.5000 mL | Freq: Once | INTRAMUSCULAR | Status: DC

## 2015-09-28 MED ORDER — DIBUCAINE 1 % RE OINT: 1.0000 "application " | TOPICAL_OINTMENT | RECTAL | Status: DC | PRN

## 2015-09-28 MED ORDER — LACTATED RINGERS IV SOLN: 500.0000 mL | Freq: Once | INTRAVENOUS | Status: DC

## 2015-09-28 MED ORDER — SIMETHICONE 80 MG PO CHEW
80.0000 mg | CHEWABLE_TABLET | ORAL | Status: DC
  Administered 2015-09-28: 80 mg via ORAL
  Filled 2015-09-28 (×2): qty 1

## 2015-09-28 MED ORDER — DIPHENHYDRAMINE HCL 25 MG PO CAPS
25.0000 mg | ORAL_CAPSULE | ORAL | Status: DC | PRN
  Filled 2015-09-28: qty 1

## 2015-09-28 MED ORDER — IBUPROFEN 600 MG PO TABS
600.0000 mg | ORAL_TABLET | Freq: Four times a day (QID) | ORAL | Status: DC
  Administered 2015-09-28 – 2015-09-30 (×7): 600 mg via ORAL
  Filled 2015-09-28 (×7): qty 1

## 2015-09-28 MED ORDER — OXYCODONE HCL 5 MG PO TABS
5.0000 mg | ORAL_TABLET | ORAL | Status: DC | PRN
  Administered 2015-09-29 – 2015-09-30 (×2): 5 mg via ORAL
  Filled 2015-09-28 (×2): qty 1

## 2015-09-28 MED ORDER — FENTANYL 2.5 MCG/ML BUPIVACAINE 1/10 % EPIDURAL INFUSION (WH - ANES)
14.0000 mL/h | INTRAMUSCULAR | Status: DC | PRN
Start: 2015-09-28 — End: 2015-09-28

## 2015-09-28 MED ORDER — FENTANYL CITRATE (PF) 100 MCG/2ML IJ SOLN
INTRAMUSCULAR | Status: DC | PRN
  Administered 2015-09-28: 10 ug via INTRATHECAL

## 2015-09-28 MED ORDER — SCOPOLAMINE 1 MG/3DAYS TD PT72: 1.0000 | MEDICATED_PATCH | Freq: Once | TRANSDERMAL | Status: DC

## 2015-09-28 MED ORDER — LACTATED RINGERS IV SOLN
INTRAVENOUS | Status: DC | PRN
  Administered 2015-09-28: 14:00:00 via INTRAVENOUS

## 2015-09-28 MED ORDER — KETOROLAC TROMETHAMINE 30 MG/ML IJ SOLN
30.0000 mg | Freq: Four times a day (QID) | INTRAMUSCULAR | Status: DC | PRN
  Administered 2015-09-28: 30 mg via INTRAMUSCULAR

## 2015-09-28 SURGICAL SUPPLY — 35 items
BENZOIN TINCTURE PRP APPL 2/3 (GAUZE/BANDAGES/DRESSINGS) ×4 IMPLANT
CANISTER SUCT 3000ML PPV (MISCELLANEOUS) ×4 IMPLANT
CHLORAPREP W/TINT 26ML (MISCELLANEOUS) ×4 IMPLANT
CLOSURE WOUND 1/2 X4 (GAUZE/BANDAGES/DRESSINGS) ×1
DRSG OPSITE POSTOP 4X10 (GAUZE/BANDAGES/DRESSINGS) ×4 IMPLANT
DRSG PAD ABDOMINAL 8X10 ST (GAUZE/BANDAGES/DRESSINGS) ×4 IMPLANT
DRSG TELFA 3X8 NADH (GAUZE/BANDAGES/DRESSINGS) IMPLANT
ELECT CAUTERY BLADE 6.4 (BLADE) ×4 IMPLANT
ELECT REM PT RETURN 9FT ADLT (ELECTROSURGICAL) ×4
ELECTRODE REM PT RTRN 9FT ADLT (ELECTROSURGICAL) ×2 IMPLANT
GLOVE BIOGEL PI IND STRL 7.0 (GLOVE) ×4 IMPLANT
GLOVE BIOGEL PI IND STRL 7.5 (GLOVE) ×2 IMPLANT
GLOVE BIOGEL PI INDICATOR 7.0 (GLOVE) ×4
GLOVE BIOGEL PI INDICATOR 7.5 (GLOVE) ×2
GLOVE SURG SS PI 7.0 STRL IVOR (GLOVE) ×4 IMPLANT
GOWN STRL REUS W/ TWL LRG LVL3 (GOWN DISPOSABLE) ×4 IMPLANT
GOWN STRL REUS W/ TWL XL LVL3 (GOWN DISPOSABLE) ×2 IMPLANT
GOWN STRL REUS W/TWL LRG LVL3 (GOWN DISPOSABLE) ×4
GOWN STRL REUS W/TWL XL LVL3 (GOWN DISPOSABLE) ×2
LIQUID BAND (GAUZE/BANDAGES/DRESSINGS) ×4 IMPLANT
NS IRRIG 1000ML POUR BTL (IV SOLUTION) ×4 IMPLANT
PACK C SECTION WH (CUSTOM PROCEDURE TRAY) ×4 IMPLANT
PAD ABD 7.5X8 STRL (GAUZE/BANDAGES/DRESSINGS) ×4 IMPLANT
PAD OB MATERNITY 4.3X12.25 (PERSONAL CARE ITEMS) ×4 IMPLANT
PAD PREP 24X48 CUFFED NSTRL (MISCELLANEOUS) ×4 IMPLANT
STRIP CLOSURE SKIN 1/2X4 (GAUZE/BANDAGES/DRESSINGS) ×3 IMPLANT
SUT MON AB 4-0 PS1 27 (SUTURE) ×4 IMPLANT
SUT MON AB-0 CT1 36 (SUTURE) ×8 IMPLANT
SUT PLAIN 2 0 (SUTURE) ×2
SUT PLAIN ABS 2-0 CT1 27XMFL (SUTURE) ×2 IMPLANT
SUT VIC AB 0 CT1 36 (SUTURE) ×8 IMPLANT
SUT VIC AB 2-0 CT1 27 (SUTURE) ×8
SUT VIC AB 2-0 CT1 TAPERPNT 27 (SUTURE) ×8 IMPLANT
SUT VIC AB 3-0 CT1 27 (SUTURE) ×2
SUT VIC AB 3-0 CT1 TAPERPNT 27 (SUTURE) ×2 IMPLANT

## 2015-09-28 NOTE — Op Note (Signed)
Cesarean Section Operative Report  Lindsay Medina  09/27/2015 - 09/28/2015  Indications: Fetal Distress   Pre-operative Diagnosis: Non-reassuring Fetal Heart tones remote from delivery  Post-operative Diagnosis: Same   Surgeon: Surgeon(s) and Role:    * Aletha Halim, MD - Primary      Jacquiline Doe MD  Assistants: none  Anesthesia: spinal    Specimens: placenta to pathology, tubes b/l to pathology  Findings: Viable female infant in cephalic presentation; Apgars 8 and 8; arterial cord pH not sent; thick meconium stained amniotic fluid; intact placenta with three vessel cord; normal uterus, fallopian tubes and ovaries bilaterally. Nuchal cord x 1 (reduced @ delivery)  Baby condition / location:  Couplet care / Skin to Skin   Complications: no complications  Indications: Lindsay Medina is a 25 y.o. G2P1001 with an IUP [redacted]w[redacted]d presenting IOL for post dates and non-reassuring fetal heart tones in office.  Procedure Details:  The patient was taken back to the operative suite where spinal anesthesia was placed.  A time out was held and the above information confirmed.   After induction of anesthesia, the patient was draped and prepped in the usual sterile manner and placed in a dorsal supine position with a leftward tilt. A Pfannenstiel incision was made and carried down through the subcutaneous tissue to the fascia. Fascial incision was made and sharply extended transversely. The fascia was separated from the underlying rectus tissue superiorly and inferiorly. The peritoneum was identified and bluntly entered and extended longitudinally. A bladder flap was created. A low transverse uterine incision was made and extended bluntly. Delivered from cephalic presentation was a viable infant with Apgars as above.  After waiting 30 seconds for delayed cord cutting, the umbilical cord was clamped and cut cord blood was obtained for evaluation. Cord ph was not sent. The placenta was  removed Intact and appeared normal. The uterus was exteriorized. The uterine outline, tubes and ovaries appeared normal. Attention was directed to the right tube which was grasped with a bobcock and ligated using the modified parkland method with 2-0 vicryl. The same procedure was carried out the on the left.The uterine incision was closed with running locked sutures of 42monocyl with an imbricating layer of the same.   Hemostasis was observed. The peritoneum was closed with 3-0 vicryl. The rectus muscles were examined and hemostasis observed. The fascia was then reapproximated with running sutures of 0Vicryl.The subcuticular closure was performed using 2-0plain gut. The skin was closed with 4-0Vicryl.   Instrument, sponge, and needle counts were correct prior the abdominal closure and were correct at the conclusion of the case.     Disposition: PACU - hemodynamically stable.       Signed: Brayton Mars 09/28/2015 3:16 PM  I was present and scrubbed for the entire procedure. Ancef 2gm given for SSI. SCDs during surgery.   Durene Romans MD Attending Center for [employer])

## 2015-09-28 NOTE — Progress Notes (Signed)
Dicussed with pt that pt has had recurrent prolonged decelerations and is still remote from delivery. We will attempt to continue cervical ripening with low dose pitocin, but if pt has another prolonged deceleration we will stop pitocin and opt for c-section. Pt is agreeable to this plan. Discussed risks of c-section including, but not limited to bleeding, infection and damage to surrounding organs. D/W pt hysterectomy is always a possibility is bleeding cannot be controled. Pt voiced understanding and signed c-section consent.

## 2015-09-28 NOTE — Progress Notes (Signed)
OB Note Went to see patient due to repetitive decels (variables and lates) which has recovered with LLR positioning and fetus currently category I and pitocin turned off. SVE still latent labor and not able to AROM. Given this I told her that I recommend primary c-section for FITL which she is amenable to. Also d/w her re: BTL and permanency and she would still like one; BTL papers are UTD.  Since patient and fetus are reassuring status, will proceed to OR after an urgent case scheduled prior to hers.   Durene Romans MD Attending Center for [employer])

## 2015-09-28 NOTE — Anesthesia Postprocedure Evaluation (Signed)
Anesthesia Post Note  Patient: Lindsay Medina  Procedure(s) Performed: Procedure(s) (LRB): CESAREAN SECTION (N/A) BILATERAL TUBAL LIGATION (Bilateral)  Patient location during evaluation: PACU Anesthesia Type: Spinal and MAC Level of consciousness: awake and alert Pain management: pain level controlled Vital Signs Assessment: post-procedure vital signs reviewed and stable Respiratory status: spontaneous breathing and respiratory function stable Cardiovascular status: blood pressure returned to baseline and stable Postop Assessment: spinal receding Anesthetic complications: no     Last Vitals:  Vitals:   09/28/15 1557 09/28/15 1558  BP:    Pulse: 67 69  Resp: 11 13  Temp:      Last Pain:  Vitals:   09/28/15 1521  TempSrc:   PainSc: 0-No pain   Pain Goal:                 Tiajuana Amass

## 2015-09-28 NOTE — Anesthesia Procedure Notes (Signed)
Spinal  Staffing Anesthesiologist: Suzette Battiest Performed: anesthesiologist  Preanesthetic Checklist Completed: patient identified, site marked, surgical consent, pre-op evaluation, timeout performed, IV checked, risks and benefits discussed and monitors and equipment checked Spinal Block Patient position: sitting Prep: site prepped and draped and DuraPrep Patient monitoring: heart rate, continuous pulse ox and blood pressure Approach: midline Location: L4-5 Injection technique: single-shot Needle Needle type: Pencan  Needle gauge: 24 G Needle length: 9 cm Assessment Sensory level: T6

## 2015-09-28 NOTE — Progress Notes (Signed)
Labor Progress Note CENA DOUTY is a 25 y.o. G2P1001 at [redacted]w[redacted]d presented for IOL PD. [facility] today and had some variables on the monitor.  S: Patient states she is having irregular contractions. She indicates that the Fentanyl is helpful for pain control  O:  BP 105/69   Pulse 87   Temp 98 F (36.7 C) (Oral)   Resp 18   Ht 5\' 2"  (1.575 m)   Wt 88.9 kg (196 lb)   LMP 11/21/2014   BMI 35.85 kg/m  EFM: 140/moderate/accels +/ sporadic variables/late decels   CVE: Foley bulb still in place   A&P: 25 y.o. G2P1001 [redacted]w[redacted]d for IOL PD. Pt was seen at the health department today and had some variables on the monitor  #Labor: start low dose pitocin + foley bulb  #Pain: Fentanyl  #FWB:Category 2   Nikolas Casher Criss Rosales, MD 6:43 AM

## 2015-09-28 NOTE — Transfer of Care (Signed)
Immediate Anesthesia Transfer of Care Note  Patient: Lindsay Medina  Procedure(s) Performed: Procedure(s): CESAREAN SECTION (N/A) BILATERAL TUBAL LIGATION (Bilateral)  Patient Location: PACU  Anesthesia Type:Spinal  Level of Consciousness: awake  Airway & Oxygen Therapy: Patient Spontanous Breathing  Post-op Assessment: Report given to RN  Post vital signs: Reviewed and stable  Last Vitals:  Vitals:   09/28/15 1332 09/28/15 1521  BP: 119/70   Pulse: 73 89  Resp: 16   Temp:      Last Pain:  Vitals:   09/28/15 1131  TempSrc: Oral  PainSc:          Complications: No apparent anesthesia complications

## 2015-09-28 NOTE — Anesthesia Preprocedure Evaluation (Addendum)
Anesthesia Evaluation  Patient identified by MRN, date of birth, ID band Patient awake    Reviewed: Allergy & Precautions, NPO status , Patient's Chart, lab work & pertinent test results  Airway Mallampati: II  TM Distance: >3 FB     Dental   Pulmonary Current Smoker,    Pulmonary exam normal        Cardiovascular negative cardio ROS Normal cardiovascular exam     Neuro/Psych negative neurological ROS     GI/Hepatic negative GI ROS, Hepatic hemangiomas   Endo/Other  negative endocrine ROS  Renal/GU negative Renal ROS     Musculoskeletal   Abdominal   Peds  Hematology negative hematology ROS (+)   Anesthesia Other Findings   Reproductive/Obstetrics (+) Pregnancy                            Lab Results  Component Value Date   WBC 14.1 (H) 09/27/2015   HGB 11.7 (L) 09/27/2015   HCT 34.4 (L) 09/27/2015   MCV 85.1 09/27/2015   PLT 345 09/27/2015   Lab Results  Component Value Date   CREATININE 0.50 05/19/2015   BUN 6 05/19/2015   NA 132 (L) 05/19/2015   K 3.6 05/19/2015   CL 107 05/19/2015   CO2 18 (L) 05/19/2015    Anesthesia Physical Anesthesia Plan  ASA: II  Anesthesia Plan: Spinal   Post-op Pain Management:    Induction:   Airway Management Planned: Natural Airway  Additional Equipment:   Intra-op Plan:   Post-operative Plan:   Informed Consent: I have reviewed the patients History and Physical, chart, labs and discussed the procedure including the risks, benefits and alternatives for the proposed anesthesia with the patient or authorized representative who has indicated his/her understanding and acceptance.     Plan Discussed with: CRNA  Anesthesia Plan Comments:         Anesthesia Quick Evaluation

## 2015-09-28 NOTE — Progress Notes (Signed)
Dr Baron Sane at bedside to discuss POC.  If infant has any more prolonged decels, POC is to go to C/S.  Answered questions for FOB and pt.  Consent signed for possible C/S.

## 2015-09-28 NOTE — Progress Notes (Signed)
Patient ID: Lindsay Medina, female   DOB: [birth year], 25 y.o.   MRN: [id number] LABOR PROGRESS NOTE  Rayann Jian Spencer-Scott is a 25 y.o. G2P1001 at [redacted]w[redacted]d  admitted for IOL for postdates  Subjective: Amenable for caesarian section d/t NRFHR  Objective: BP 120/75   Pulse 75   Temp 98.1 F (36.7 C) (Oral)   Resp 16   Ht 5\' 2"  (1.575 m)   Wt 88.9 kg (196 lb)   LMP 11/21/2014   SpO2 100%   BMI 35.85 kg/m  or  Vitals:   09/28/15 1001 09/28/15 1033 09/28/15 1101 09/28/15 1131  BP:      Pulse:      Resp: 16 16 16 16   Temp:    98.1 F (36.7 C)  TempSrc:    Oral  SpO2:      Weight:      Height:        Dilation: 3 Effacement (%): 90 Station: -2 Presentation: Vertex Exam by:: Dr Baron Sane FHT: 140 baseline, moderate variability, no accelerations, recurrent decelerations Uterine activity: irregular  Labs: Lab Results  Component Value Date   WBC 14.1 (H) 09/27/2015   HGB 11.7 (L) 09/27/2015   HCT 34.4 (L) 09/27/2015   MCV 85.1 09/27/2015   PLT 345 09/27/2015    Patient Active Problem List   Diagnosis Date Noted  . Post-dates pregnancy 09/27/2015    Assessment / Plan: 25 y.o. G2P1001 at [redacted]w[redacted]d here for IOL for postdates, now going for c-section  Labor: c-section for [facility] Fetal Wellbeing:  Category II Pain Control:  epidural Anticipated MOD:  Weber, DO PGY-1 8/29/201712:03 PM

## 2015-09-28 NOTE — Progress Notes (Signed)
POC for C/S initiated.  Risks and benefits reviewed with pt and FOB.

## 2015-09-29 ENCOUNTER — Encounter (HOSPITAL_COMMUNITY): Payer: Self-pay | Admitting: Anesthesiology

## 2015-09-29 LAB — CBC
HEMATOCRIT: 27.4 % — AB (ref 36.0–46.0)
Hemoglobin: 9.2 g/dL — ABNORMAL LOW (ref 12.0–15.0)
MCH: 28.8 pg (ref 26.0–34.0)
MCHC: 33.6 g/dL (ref 30.0–36.0)
MCV: 85.6 fL (ref 78.0–100.0)
Platelets: 284 10*3/uL (ref 150–400)
RBC: 3.2 MIL/uL — ABNORMAL LOW (ref 3.87–5.11)
RDW: 13.9 % (ref 11.5–15.5)
WBC: 17.5 10*3/uL — AB (ref 4.0–10.5)

## 2015-09-29 NOTE — Plan of Care (Signed)
Problem: Activity: Goal: Ability to tolerate increased activity will improve Outcome: Completed/Met Date Met: 09/29/15 Did pt's orthostatic vitals and they were WNL so we ambulated to the bathroom without any dizziness or lightheadedness.

## 2015-09-29 NOTE — Lactation Note (Deleted)
This note was copied from a baby's chart. Lactation Consultation Note  Patient Name: Lindsay Medina [id number] Date: 09/29/2015 Reason for consult: Follow-up assessment;Breast/nipple pain  Mom does not want to put baby to breast due to sore nipples. She is considering changing to pump/bottle feeding. Mom reports she has DEBP at home. LC advised Mom she needs to be pumping every 3 hours for 15-30 minutes to encourage milk production/protect milk supply. Parents having been using 5 fr feeding tube/syringe to supplement, advised to change to DR. Brown bottle #1, Mom reports she has these bottles at home. Reviewed supplemental guidelines with parents and per hours of age advised baby should have 30-60 ml each feeding. Mom plans to change to Enfamil formula at home to use till her milk comes in. Engorgement care reviewed if needed. Advised to refer to Baby N Me Booklet, page 25 for breast milk storage guidelines. Discussed OP services if Mom would like assist to get baby back to breast. Care for sore nipples reviewed.   Maternal Data    Feeding Feeding Type: Formula Nipple Type: Slow - flow  LATCH Score/Interventions                      Lactation Tools Discussed/Used Tools: Nipple [place] Fuel;Shells;Pump;19F feeding tube / Syringe;Comfort gels Nipple shield size: 20;24 Shell Type: Inverted Breast pump type: Double-Electric Breast Pump   Consult Status Consult Status: Complete    Katrine Coho 09/29/2015, 9:08 AM

## 2015-09-29 NOTE — Anesthesia Postprocedure Evaluation (Signed)
Anesthesia Post Note  Patient: Lindsay Medina  Procedure(s) Performed: Procedure(s) (LRB): CESAREAN SECTION (N/A) BILATERAL TUBAL LIGATION (Bilateral)  Patient location during evaluation: Mother Baby Anesthesia Type: Spinal Level of consciousness: awake and alert and oriented Pain management: satisfactory to patient Vital Signs Assessment: post-procedure vital signs reviewed and stable Respiratory status: respiratory function stable and spontaneous breathing Cardiovascular status: blood pressure returned to baseline Postop Assessment: no headache, no backache, spinal receding, patient able to bend at knees and adequate PO intake Anesthetic complications: no     Last Vitals:  Vitals:   09/29/15 0340 09/29/15 0725  BP: (!) 105/53 99/60  Pulse: 77 72  Resp: 20 20  Temp: 36.8 C 36.8 C    Last Pain:  Vitals:   09/29/15 0725  TempSrc: Oral  PainSc:    Pain Goal:                 Lindsay Medina

## 2015-09-29 NOTE — Addendum Note (Signed)
Addendum  created 09/29/15 [id number] by Flossie Dibble, CRNA   Sign clinical note

## 2015-09-29 NOTE — Progress Notes (Signed)
UR chart review completed.  

## 2015-09-29 NOTE — Progress Notes (Signed)
Subjective: Postoperative Day 1: Cesarean Delivery Patient reports mild well-controlled incisional pain and tolerating PO. Has foley in and has not yet had BM or passed gas.   Objective: Vital signs in last 24 hours: Temp:  [97.6 F (36.4 C)-98.3 F (36.8 C)] 98.3 F (36.8 C) (08/30 0725) Pulse Rate:  [65-89] 72 (08/30 0725) Resp:  [9-29] 20 (08/30 0725) BP: (70-119)/(53-78) 99/60 (08/30 0725) SpO2:  [96 %-100 %] 100 % (08/30 0725)  Physical Exam:  General: alert, cooperative and no distress Lochia: appropriate Uterine Fundus: firm Incision: healing well, no significant drainage, no dehiscence, no significant erythema, dressing c/d/i DVT Evaluation: No evidence of DVT seen on physical exam. Negative Homan's sign.   Recent Labs  09/27/15 1300 09/29/15 0546  HGB 11.7* 9.2*  HCT 34.4* 27.4*    Assessment/Plan: Status post Cesarean section. Doing well postoperatively.  Continue current care.  Bufford Lope PGY-1 09/29/2015, 7:58 AM  OB FELLOW POSTPARTUM PROGRESS NOTE ATTESTATION  I have seen and examined this patient and agree with above documentation in the resident's note.   Jacquiline Doe, MD 8:58 AM

## 2015-09-29 NOTE — Progress Notes (Signed)
CLINICAL SOCIAL WORK MATERNAL/CHILD NOTE  Patient Details  Name: Lindsay Medina MRN: [id number] Date of Birth: 09/28/2017  Date:  09/29/2015  Clinical Social Worker Initiating Note:  Lilly Cove, LCSW            Date/ Time Initiated:  09/29/15/0949                        Child's Name:  Lindsay Medina   Legal Guardian:  Mother   Need for Interpreter:  None   Date of Referral:  09/29/15     Reason for Referral:  Current Substance Use/Substance Use During Pregnancy , Other (Comment) (Assessment of needs)   Referral Source:  RN   Address:     Phone number:      Household Members: Minor Children, Significant Other   Natural Supports (not living in the home): Extended Family, Friends   Professional Supports:None   Employment:Homemaker   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  LCSW met with MOB in room along with FOB.  MOB gives permission to speak about substance use in front of FOB. LCSW explained role in hospital and reason for consult. MOB aware and reports she was honest with provider at [facility] Department that she did smoke prior to pregnancy and was around substances during pregnancy, but did not smoke. LCSW explained 2 positive results for MOB and baby pending. Explained hospital drug policy and cord which LCSW will follow. MOB was aware and verbalized  understanding stating "Nothing to worry about, I haven't smoked".    LCSW assessed for all other needs including home prepared for child, community support, and PPD/anxiety. MOB denied all reporting she has made contact with WIC and has in place along with medicaid and supplies for child.  No other needs warranted at this time. MOB declined referrals for SA as well and PPD/anxiety information.  No formal referrals completed at this time, but offered.  CSW Plan/Description: Information/Referral to [employer] , [employer] , No Further Intervention Required/No Barriers to Discharge (LCSW will follow cord and if positive will make report)    Lilly Cove, LCSW 09/29/2015, 9:51 AM

## 2015-09-30 MED ORDER — IBUPROFEN 600 MG PO TABS: 600.0000 mg | ORAL_TABLET | Freq: Four times a day (QID) | ORAL | 0 refills | Status: DC

## 2015-09-30 MED ORDER — OXYCODONE HCL 5 MG PO TABS: 5.0000 mg | ORAL_TABLET | ORAL | 0 refills | Status: DC | PRN

## 2015-09-30 NOTE — Discharge Summary (Signed)
Obstetric Discharge Summary Reason for Admission: induction of labor for postterm Prenatal Procedures: none Intrapartum Procedures: cesarean: low cervical, transverse, tubal ligation and GBS prophylaxis Postpartum Procedures: none Complications-Operative and Postpartum: none Hemoglobin  Date Value Ref Range Status  09/29/2015 9.2 (L) 12.0 - 15.0 g/dL Final    Comment:    DELTA CHECK NOTED REPEATED TO VERIFY    HCT  Date Value Ref Range Status  09/29/2015 27.4 (L) 36.0 - 46.0 % Final    Physical Exam:  General: alert, cooperative and appears stated age 25: appropriate Uterine Fundus: firm Incision: healing well DVT Evaluation: No evidence of DVT seen on physical exam.  Discharge Diagnoses: Term Pregnancy-delivered  Discharge Information: Date: 09/30/2015 Activity: unrestricted Diet: routine Medications: Ibuprofen, Colace and oxycodone Condition: stable Instructions: refer to practice specific booklet Discharge to: home     Medication List    STOP taking these medications   acetaminophen 325 MG tablet Commonly known as:  TYLENOL     TAKE these medications   ibuprofen 600 MG tablet Commonly known as:  ADVIL,MOTRIN Take 1 tablet (600 mg total) by mouth every 6 (six) hours.   oxyCODONE 5 MG immediate release tablet Commonly known as:  Oxy IR/ROXICODONE Take 1 tablet (5 mg total) by mouth every 4 (four) hours as needed (pain scale 4-7).   prenatal multivitamin Tabs tablet Take 1 tablet by mouth daily.       Newborn Data: Live born female  Birth Weight: 6 lb 10.9 oz (3030 g) APGAR: 8, 8  Home with Mom.  Lindsay Medina 09/30/2015, 7:30 AM   CNM attestation  Wilber Oliphant Spencer-Scott is a 25 y.o. G2P2001 s/p pLTCS.   Pain is well controlled.  Plan for birth control is bilateral tubal ligation.  Method of Feeding: bottle  PE:  BP (!) 104/49   Pulse 77   Temp 98.2 F (36.8 C)   Resp 18   Ht 5\' 2"  (1.575 m)   Wt 88.9 kg (196 lb)   LMP 11/21/2014   SpO2  100%   Breastfeeding? Unknown   BMI 35.85 kg/m  Fundus firm Inc: dsg intact   Recent Labs  09/27/15 1300 09/29/15 0546  HGB 11.7* 9.2*  HCT 34.4* 27.4*     Plan: discharge today - postpartum care discussed - f/u [facility] in 6 weeks for postpartum visit   Serita Grammes, CNM 8:56 AM  09/30/2015

## 2015-09-30 NOTE — Progress Notes (Signed)
Pressure dressing still in place during morning assessment. RN instructed patient to remove pressure dressing when she showers.

## 2015-09-30 NOTE — Discharge Instructions (Signed)

## 2015-10-01 ENCOUNTER — Inpatient Hospital Stay (HOSPITAL_COMMUNITY): Payer: Medicaid Other

## 2016-02-22 IMAGING — US US OB COMP LESS 14 WK
1 series · 12 of 12 positions shown · non-contrast
Comparison: None.

CLINICAL DATA: Cramping, back pain, fever. No fetal heart tones.
Estimated gestational age by LMP is 16 weeks 1 day.

EXAM:
OBSTETRIC <14 WK ULTRASOUND
TECHNIQUE: Transabdominal ultrasound was performed for evaluation of the
gestation as well as the maternal uterus and adnexal regions.

[Series 1: us ob comp less 14 wk · 12 of 12 slices shown]
[im 1/12]
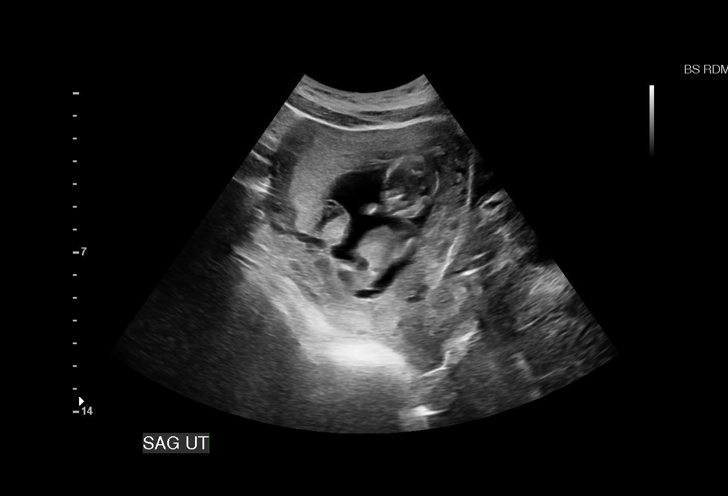
[im 2/12]
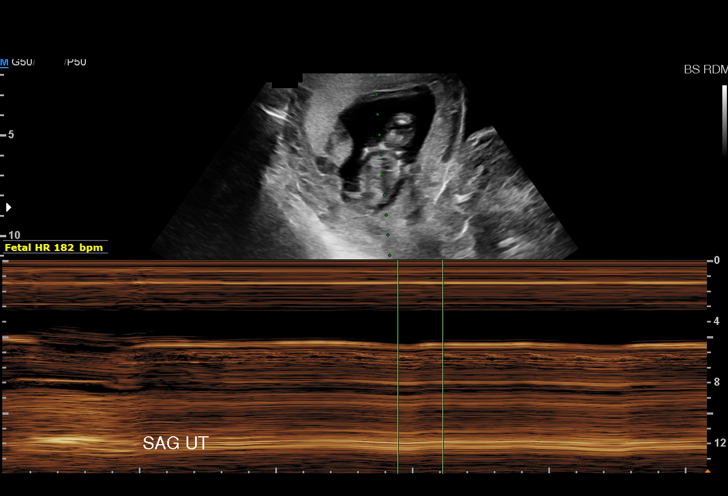
[im 3/12]
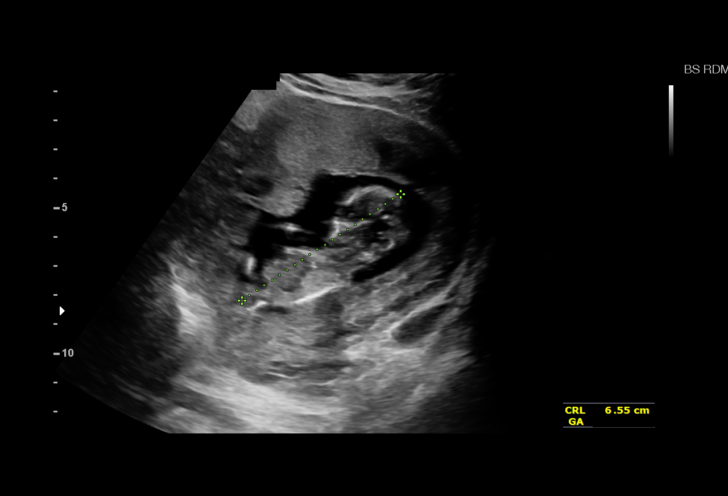
[im 4/12]
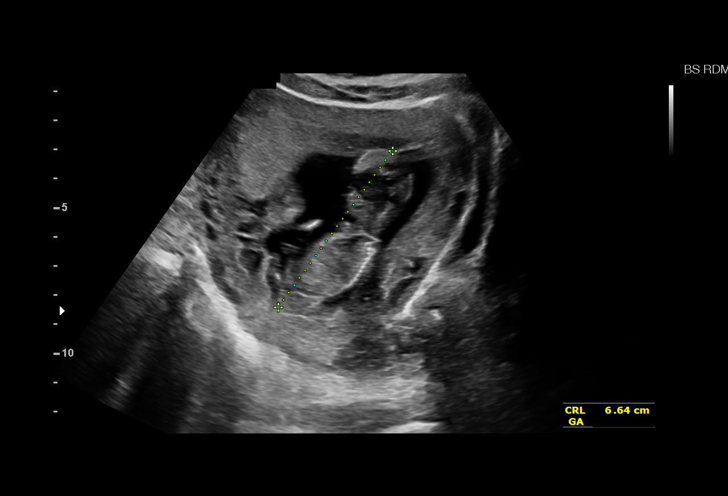
[im 5/12]
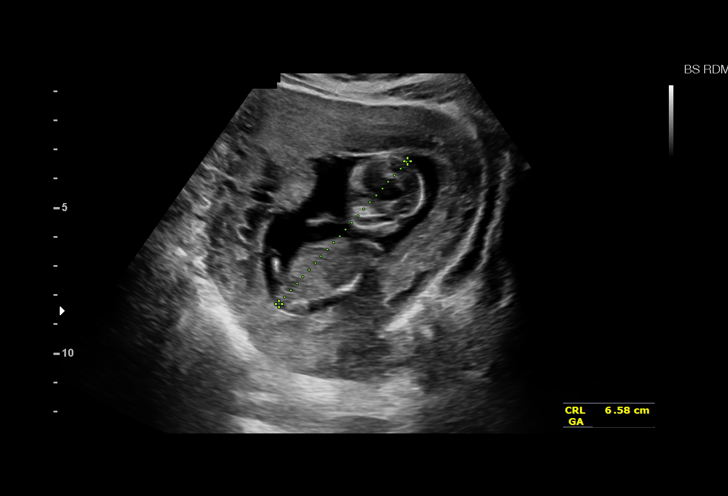
[im 6/12]
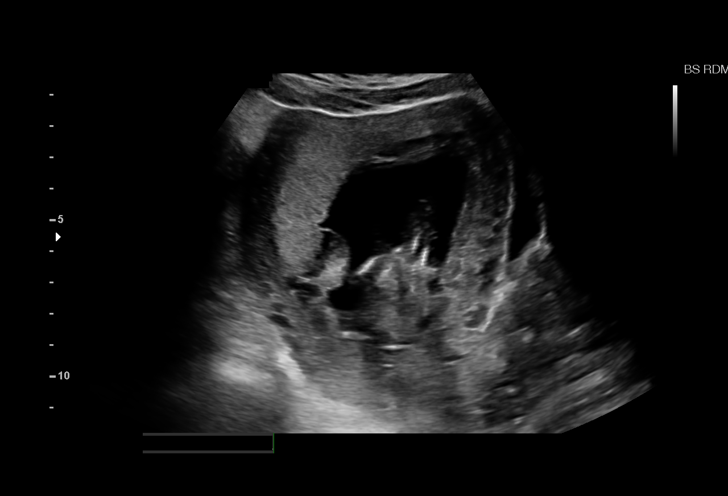
[im 7/12]
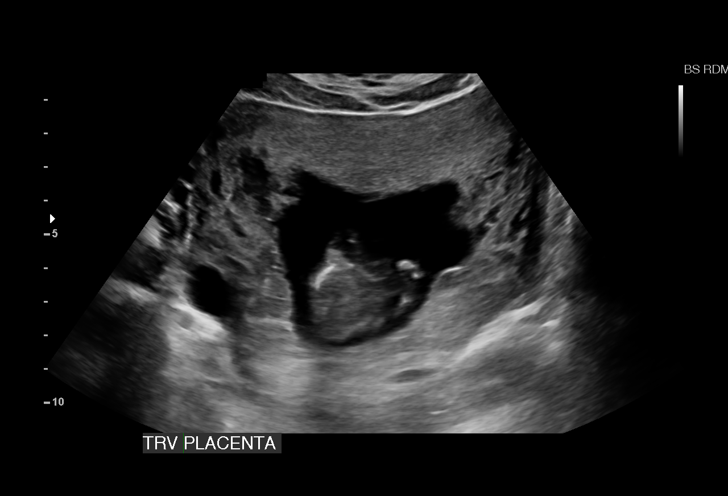
[im 8/12]
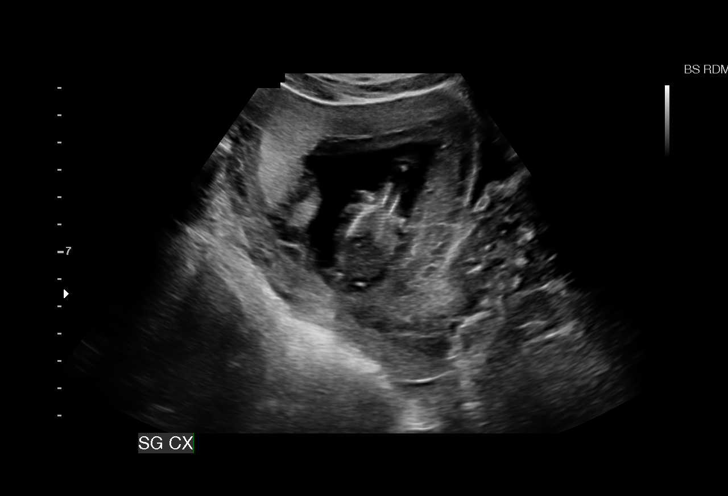
[im 9/12]
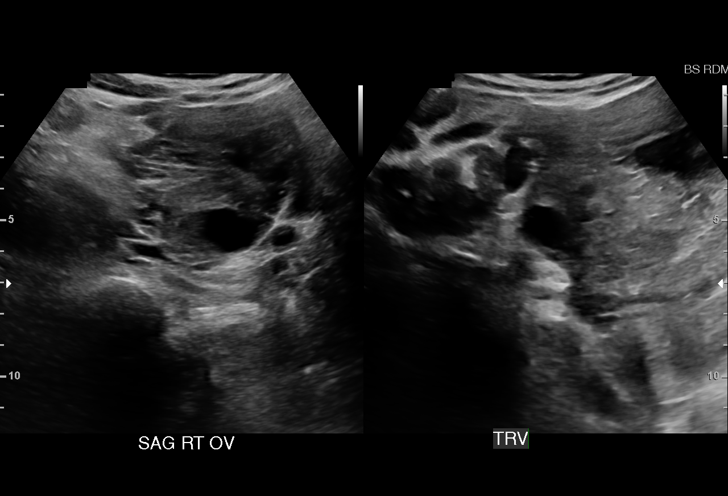
[im 10/12]
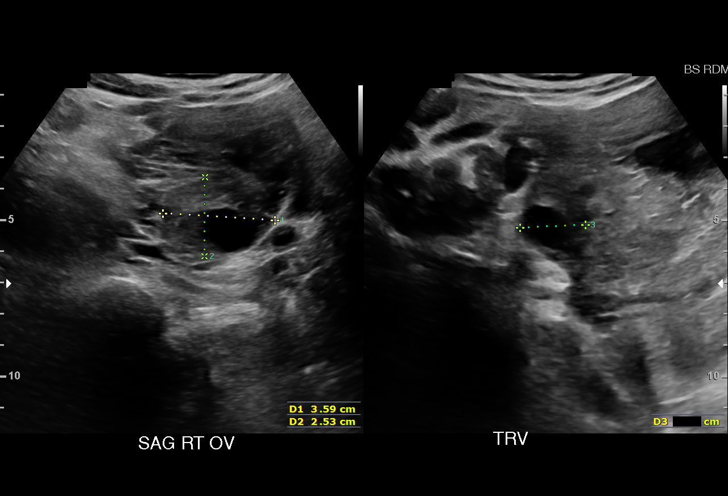
[im 11/12]
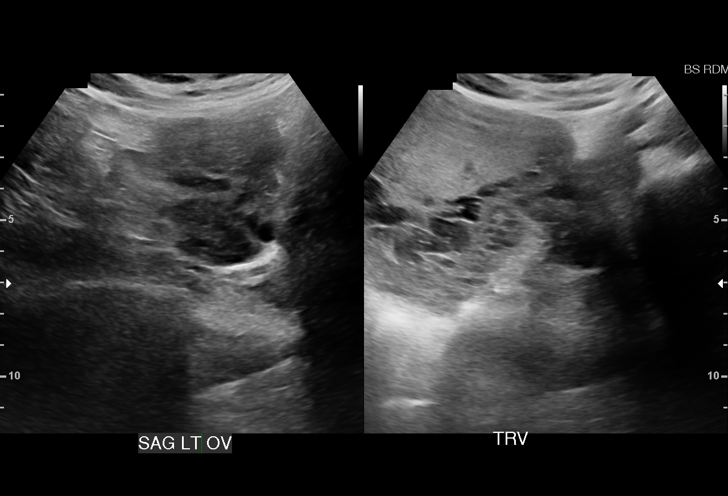
[im 12/12]
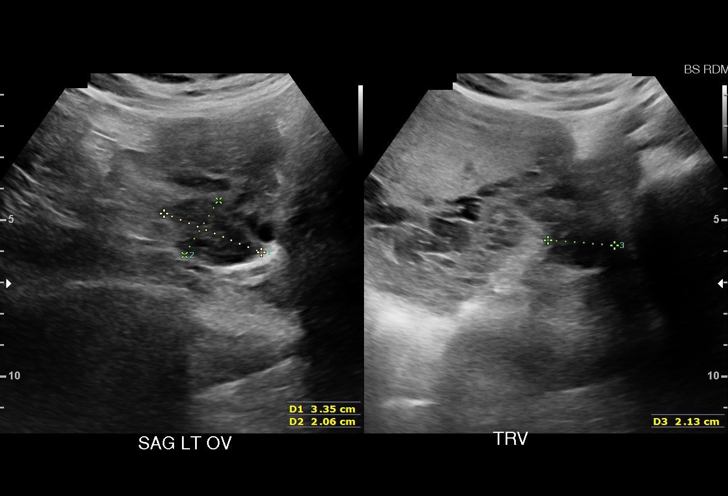

[12 of 12 positions shown; findings below may reference images not displayed]

FINDINGS: Intrauterine gestational sac: A single intrauterine pregnancy is
identified.

Yolk sac: Yolk sac is not identified. This is consistent with
gestational age.

Embryo:  Fetal pole is present.

Cardiac Activity: Fetal cardiac activity is identified.

Heart Rate: 182 bpm

CRL:   65.9  mm   12 w 6 d                  US EDC: 09/20/2015

Subchorionic hemorrhage:  None visualized.

Maternal uterus/adnexae: Uterus is anteverted. No myometrial mass
lesions. Both ovaries are identified and demonstrate normal
follicular changes. No abnormal adnexal masses. No free fluid in the
pelvis.
IMPRESSION: Single intrauterine pregnancy identified. Crown-rump length
consistent with estimated gestational age of 12 weeks 6 days. No
acute complication is suggested on ultrasound.

## 2016-02-22 IMAGING — CR DG CHEST 2V
2 series · 2 of 2 positions shown · non-contrast
Comparison: 07/22/2007

CLINICAL DATA: Abnormal lung sounds. Flu-like symptoms for 2 days.
Cough and shortness of breath. Fever. Previous smoker. Patient is 12
weeks pregnant and was shielded.

EXAM:
CHEST  2 VIEW

[chest pa]
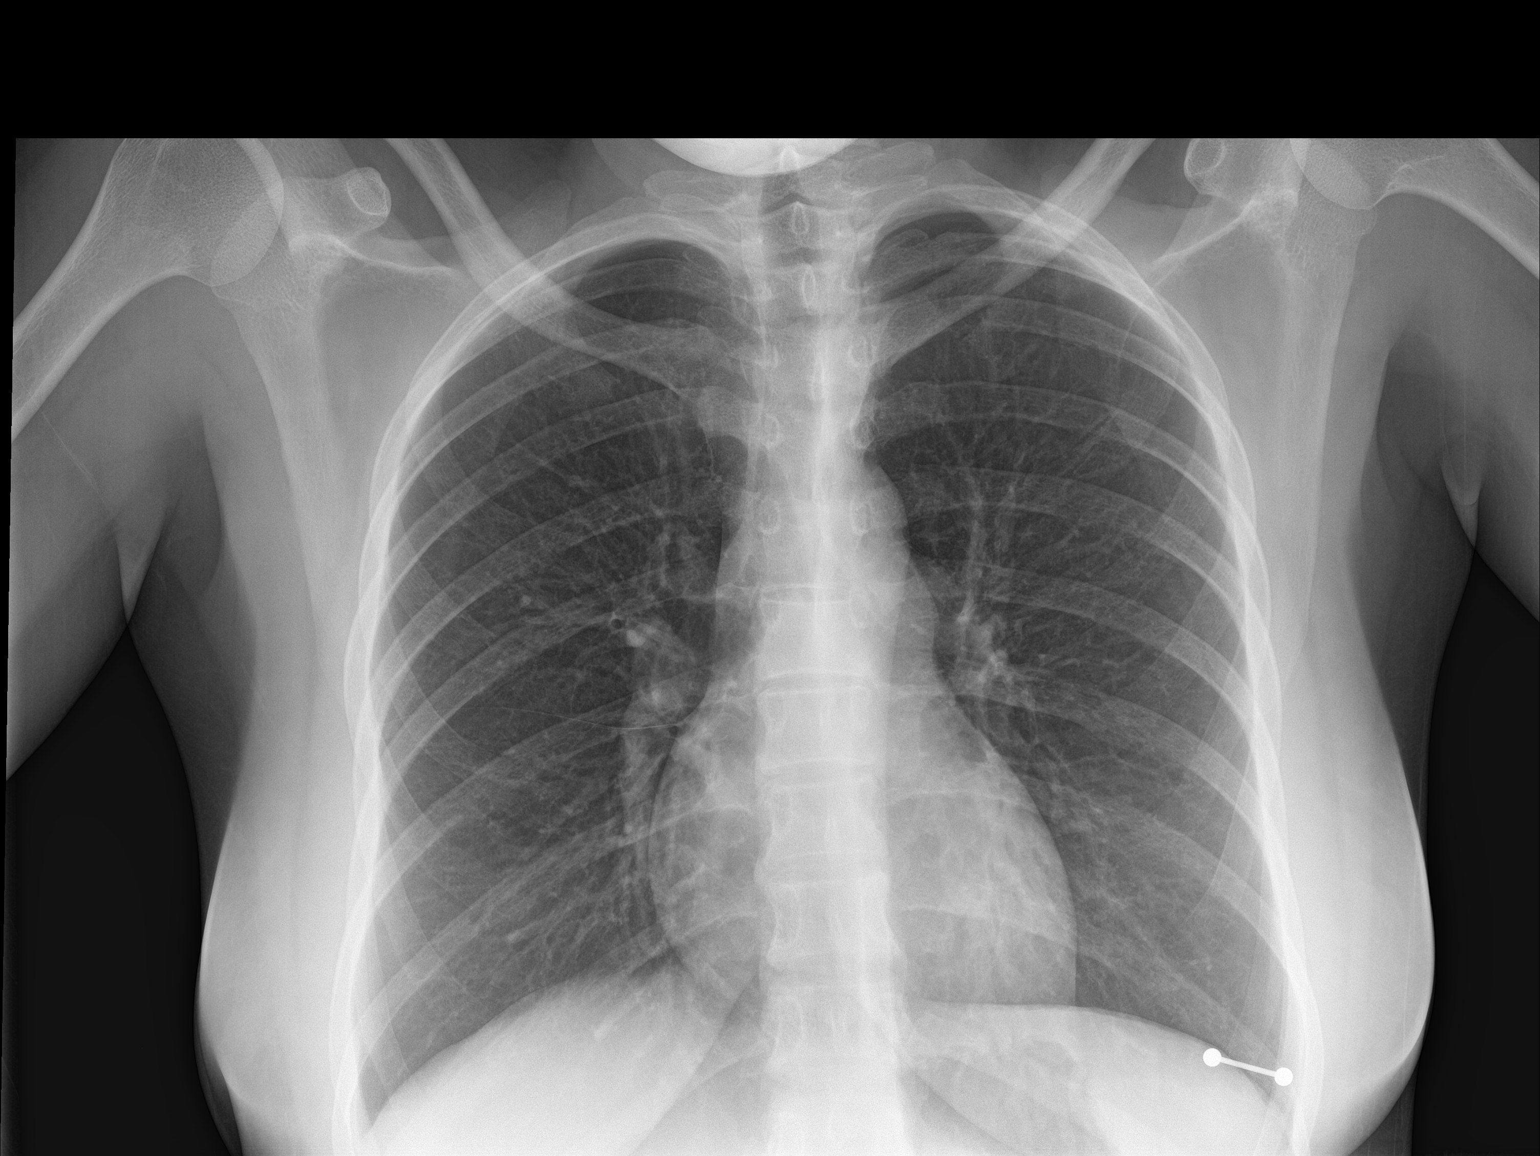

[chest lat]
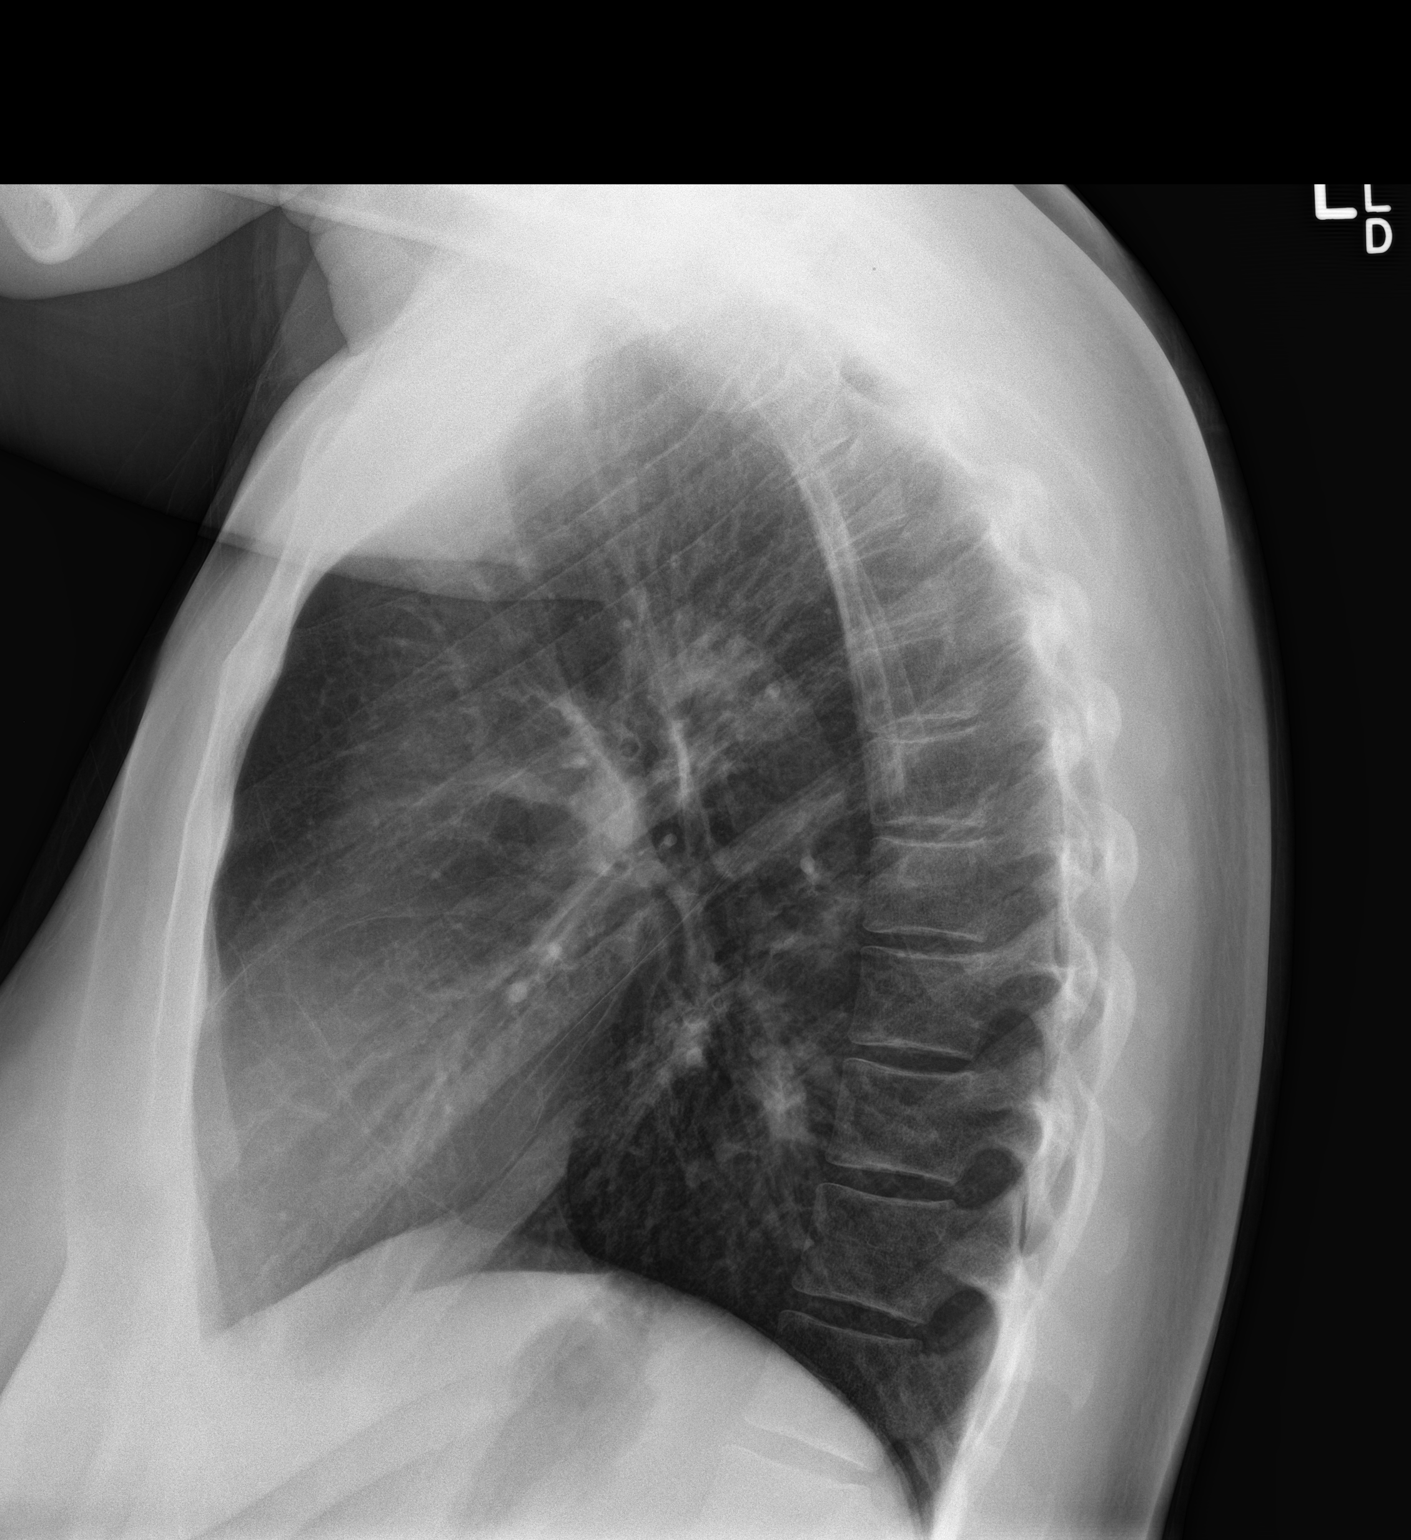

[2 of 2 positions shown; findings below may reference images not displayed]

FINDINGS: The heart size and mediastinal contours are within normal limits.
Both lungs are clear. The visualized skeletal structures are
unremarkable.
IMPRESSION: No active cardiopulmonary disease.

## 2017-11-22 ENCOUNTER — Ambulatory Visit (HOSPITAL_COMMUNITY)
Admission: EM | Admit: 2017-11-22 | Discharge: 2017-11-22 | Disposition: A | Discharge status: Home / Self Care | Payer: Medicaid Other | Attending: Family Medicine | Admitting: Family Medicine

## 2017-11-22 ENCOUNTER — Encounter (HOSPITAL_COMMUNITY): Payer: Self-pay

## 2017-11-22 DIAGNOSIS — H209 Unspecified iridocyclitis: Secondary | ICD-10-CM

## 2017-11-22 MED ORDER — TOBRAMYCIN 0.3 % OP SOLN: 2.0000 [drp] | OPHTHALMIC | 0 refills | Status: DC

## 2017-11-22 MED ORDER — PREDNISOLONE ACETATE 1 % OP SUSP: 1.0000 [drp] | Freq: Four times a day (QID) | OPHTHALMIC | 0 refills | Status: DC

## 2017-11-22 NOTE — ED Triage Notes (Signed)
Pt presents with foreign body in right eye, the eye is irritated, red with some swelling, itchy and hurting.

## 2017-11-22 NOTE — Discharge Instructions (Signed)
Please begin predforte drops (steroid drops) And tobramycin eye drops (antibiotic)  Please follow up with ophthalmology  Please go to ED if developing lack of vision, swelling, worsening pain

## 2017-11-22 NOTE — ED Provider Notes (Signed)
[place]    CSN: [id number] Arrival date & time: 11/22/17  1415     History   Chief Complaint Chief Complaint  Patient presents with  . Foreign Body in Eye    Right    HPI Lindsay Medina is a 27 y.o. female no significant past medical history presenting today for evaluation of right eye irritation and possible foreign body.  Patient states that she woke up this morning with her right eye irritated, red and felt like she had a foreign body in it.  She has discomfort with looking straight forward, photosensitivity.  Patient does wear contacts, last worn yesterday night.  She is rinsed her eyes out with eyewash.  She has not used any other over-the-counter drops.  She has had some blurry redness and watery drainage, but denies vision changes.  Denies previous eye issues with her right eye.  HPI  Past Medical History:  Diagnosis Date  . Hepatic hemangioma   . Medical history non-contributory   . Trichomonas contact, treated     Patient Active Problem List   Diagnosis Date Noted  . Post-dates pregnancy 09/27/2015    Past Surgical History:  Procedure Laterality Date  . CESAREAN SECTION N/A 09/28/2015   Procedure: CESAREAN SECTION;  Surgeon: Aletha Halim, MD;  Location: [facility];  Service: Obstetrics;  Laterality: N/A;  . NO PAST SURGERIES    . TUBAL LIGATION Bilateral 09/28/2015   Procedure: BILATERAL TUBAL LIGATION;  Surgeon: Aletha Halim, MD;  Location: [facility];  Service: Obstetrics;  Laterality: Bilateral;    OB History    Gravida  2   Para  2   Term  2   Preterm      AB      Living  1     SAB      TAB      Ectopic      Multiple  0   Live Births               Home Medications    Prior to Admission medications   Medication Sig Start Date End Date Taking? Authorizing Provider  ibuprofen (ADVIL,MOTRIN) 600 MG tablet Take 1 tablet (600 mg total) by mouth every 6 (six) hours. 09/30/15   Serita Grammes D,  CNM  oxyCODONE (OXY IR/ROXICODONE) 5 MG immediate release tablet Take 1 tablet (5 mg total) by mouth every 4 (four) hours as needed (pain scale 4-7). 09/30/15   Myrtis Ser, CNM  prednisoLONE acetate (PRED FORTE) 1 % ophthalmic suspension Place 1 drop into the right eye 4 (four) times daily. 11/22/17   Colisha Redler C, PA-C  Prenatal Vit-Fe Fumarate-FA (PRENATAL MULTIVITAMIN) TABS tablet Take 1 tablet by mouth daily.     [provider]  tobramycin (TOBREX) 0.3 % ophthalmic solution Place 2 drops into the right eye every 4 (four) hours. 11/22/17   Lota Leamer, Elesa Hacker, PA-C    Family History Family History  Problem Relation Age of Onset  . Cancer Maternal Grandmother   . Cancer Maternal Grandfather     Social History Social History   Tobacco Use  . Smoking status: Current Every Day Smoker    Packs/day: 0.20    Types: Cigarettes  . Smokeless tobacco: Never Used  Substance Use Topics  . Alcohol use: Yes    Comment: weekly  . Drug use: Yes    Types: Marijuana     Allergies   Peanuts [peanut oil]   Review of Systems  Review of Systems  Constitutional: Negative for activity change, appetite change, chills, fatigue and fever.  HENT: Negative for congestion, ear pain, rhinorrhea, sinus pressure, sore throat and trouble swallowing.   Eyes: Positive for photophobia, pain, discharge, redness and itching. Negative for visual disturbance.  Respiratory: Negative for cough, chest tightness and shortness of breath.   Cardiovascular: Negative for chest pain.  Gastrointestinal: Negative for abdominal pain, diarrhea, nausea and vomiting.  Musculoskeletal: Negative for myalgias.  Skin: Negative for rash.  Neurological: Negative for dizziness, light-headedness and headaches.     Physical Exam Triage Vital Signs ED Triage Vitals  Enc Vitals Group     BP 11/22/17 1427 114/68     Pulse Rate 11/22/17 1427 76     Resp 11/22/17 1427 20     Temp 11/22/17 1427 98 F (36.7 C)      Temp Source 11/22/17 1427 Oral     SpO2 11/22/17 1427 98 %     Weight --      Height --      Head Circumference --      Peak Flow --      Pain Score 11/22/17 1424 7     Pain Loc --      Pain Edu? --      Excl. in [place]? --    No data found.  Updated Vital Signs BP 114/68 (BP Location: Left Arm)   Pulse 76   Temp 98 F (36.7 C) (Oral)   Resp 20   SpO2 98%   Visual Acuity Right Eye Distance:   Left Eye Distance:   Bilateral Distance:    Right Eye Near:  20/30 Left Eye Near:   20/30 Bilateral Near:   20/25  Physical Exam  Constitutional: She is oriented to person, place, and time. She appears well-developed and well-nourished.  No acute distress  HENT:  Head: Normocephalic and atraumatic.  Nose: Nose normal.  Bilateral ears without tenderness to palpation of external auricle, tragus and mastoid, EAC's without erythema or swelling, TM's with good bony landmarks and cone of light. Non erythematous.  Eyes: Pupils are equal, round, and reactive to light. Conjunctivae and EOM are normal.  Right eye injected, conjunctive erythematous compared to left, limbus is also erythematous, on fluorescein staining, no abrasion or ulcer observed  Patient has discomfort with checking pupils, frequent tearing and hesitant to keep eye open, discomfort did not improve with tetracaine, eye rinsed out, no foreign body observed  Neck: Neck supple.  Cardiovascular: Normal rate.  Pulmonary/Chest: Effort normal. No respiratory distress.  Abdominal: She exhibits no distension.  Musculoskeletal: Normal range of motion.  Neurological: She is alert and oriented to person, place, and time.  Skin: Skin is warm and dry.  Psychiatric: She has a normal mood and affect.  Nursing note and vitals reviewed.    UC Treatments / Results  Labs (all labs ordered are listed, but only abnormal results are displayed) Labs Reviewed - No data to display  EKG None  Radiology No results  found.  Procedures Procedures (including critical care time)  Medications Ordered in UC Medications - No data to display  Initial Impression / Assessment and Plan / UC Course  I have reviewed the triage vital signs and the nursing notes.  Pertinent labs & imaging results that were available during my care of the patient were reviewed by me and considered in my medical decision making (see chart for details).     Patient with right eye redness and irritation,  symptoms concerning for conjunctivitis versus iritis, no ulcer or abrasion.  Will treat with penicillin drops, tobramycin drops.  Follow-up with ophthalmology.  Visual acuity intact.Discussed strict return precautions. Patient verbalized understanding and is agreeable with plan.  Final Clinical Impressions(s) / UC Diagnoses   Final diagnoses:  Iritis     Discharge Instructions     Please begin predforte drops (steroid drops) And tobramycin eye drops (antibiotic)  Please follow up with ophthalmology  Please go to ED if developing lack of vision, swelling, worsening pain    ED Prescriptions    Medication Sig Dispense Auth. Provider   prednisoLONE acetate (PRED FORTE) 1 % ophthalmic suspension Place 1 drop into the right eye 4 (four) times daily. 5 mL Yarden Manuelito C, PA-C   tobramycin (TOBREX) 0.3 % ophthalmic solution Place 2 drops into the right eye every 4 (four) hours. 5 mL Syna Gad C, PA-C     Controlled Substance Prescriptions [place] Controlled Substance Registry consulted? Not Applicable   Janith Lima, [place] 11/22/17 1623

## 2019-11-18 ENCOUNTER — Emergency Department (HOSPITAL_COMMUNITY): Payer: Medicaid Other

## 2019-11-18 ENCOUNTER — Encounter (HOSPITAL_COMMUNITY): Payer: Self-pay | Admitting: Emergency Medicine

## 2019-11-18 ENCOUNTER — Emergency Department (HOSPITAL_COMMUNITY)
Admission: EM | Admit: 2019-11-18 | Discharge: 2019-11-18 | Disposition: A | Discharge status: Home / Self Care | Payer: Medicaid Other | Attending: Emergency Medicine | Admitting: Emergency Medicine

## 2019-11-18 ENCOUNTER — Other Ambulatory Visit: Payer: Self-pay

## 2019-11-18 DIAGNOSIS — F17210 Nicotine dependence, cigarettes, uncomplicated: Secondary | ICD-10-CM | POA: Diagnosis not present

## 2019-11-18 DIAGNOSIS — R079 Chest pain, unspecified: Secondary | ICD-10-CM

## 2019-11-18 DIAGNOSIS — Z91010 Allergy to peanuts: Secondary | ICD-10-CM | POA: Diagnosis not present

## 2019-11-18 DIAGNOSIS — U071 COVID-19: Secondary | ICD-10-CM | POA: Diagnosis not present

## 2019-11-18 LAB — CBC
HCT: 44 % (ref 36.0–46.0)
Hemoglobin: 14.8 g/dL (ref 12.0–15.0)
MCH: 29.7 pg (ref 26.0–34.0)
MCHC: 33.6 g/dL (ref 30.0–36.0)
MCV: 88.4 fL (ref 80.0–100.0)
Platelets: 245 10*3/uL (ref 150–400)
RBC: 4.98 MIL/uL (ref 3.87–5.11)
RDW: 12.6 % (ref 11.5–15.5)
WBC: 8.8 10*3/uL (ref 4.0–10.5)
nRBC: 0 % (ref 0.0–0.2)

## 2019-11-18 LAB — COMPREHENSIVE METABOLIC PANEL
ALT: 23 U/L (ref 0–44)
AST: 26 U/L (ref 15–41)
Albumin: 4 g/dL (ref 3.5–5.0)
Alkaline Phosphatase: 51 U/L (ref 38–126)
Anion gap: 13 (ref 5–15)
BUN: 7 mg/dL (ref 6–20)
CO2: 21 mmol/L — ABNORMAL LOW (ref 22–32)
Calcium: 8.7 mg/dL — ABNORMAL LOW (ref 8.9–10.3)
Chloride: 107 mmol/L (ref 98–111)
Creatinine, Ser: 0.75 mg/dL (ref 0.44–1.00)
GFR, Estimated: >60 mL/min (ref >60)
Glucose, Bld: 96 mg/dL (ref 70–99)
Potassium: 3.3 mmol/L — ABNORMAL LOW (ref 3.5–5.1)
Sodium: 141 mmol/L (ref 135–145)
Total Bilirubin: 0.6 mg/dL (ref 0.3–1.2)
Total Protein: 7.9 g/dL (ref 6.5–8.1)

## 2019-11-18 LAB — LIPASE, BLOOD: Lipase: 28 U/L (ref 11–51)

## 2019-11-18 LAB — I-STAT BETA HCG BLOOD, ED (MC, WL, AP ONLY): I-stat hCG, quantitative: <5 m[IU]/mL (ref <5)

## 2019-11-18 LAB — D-DIMER, QUANTITATIVE: D-Dimer, Quant: 0.7 ug/mL-FEU — ABNORMAL HIGH (ref 0.00–0.50)

## 2019-11-18 MED ORDER — SODIUM CHLORIDE 0.9 % IV BOLUS
1000.0000 mL | Freq: Once | INTRAVENOUS | Status: AC
Start: 2019-11-18 — End: 2019-11-18
  Administered 2019-11-18: 1000 mL via INTRAVENOUS

## 2019-11-18 MED ORDER — ONDANSETRON HCL 4 MG/2ML IJ SOLN
4.0000 mg | Freq: Once | INTRAMUSCULAR | Status: AC
  Administered 2019-11-18: 4 mg via INTRAVENOUS
  Filled 2019-11-18: qty 2

## 2019-11-18 MED ORDER — NAPROXEN 500 MG PO TABS: 500.0000 mg | ORAL_TABLET | Freq: Two times a day (BID) | ORAL | 0 refills | Status: DC

## 2019-11-18 MED ORDER — BENZONATATE 100 MG PO CAPS: 100.0000 mg | ORAL_CAPSULE | Freq: Three times a day (TID) | ORAL | 0 refills | Status: DC

## 2019-11-18 MED ORDER — FENTANYL CITRATE (PF) 100 MCG/2ML IJ SOLN
50.0000 ug | Freq: Once | INTRAMUSCULAR | Status: AC
  Administered 2019-11-18: 50 ug via INTRAVENOUS
  Filled 2019-11-18: qty 2

## 2019-11-18 NOTE — Discharge Instructions (Signed)
Please read and follow all provided instructions.  Your diagnoses today include:  1. Chest pain     Tests performed today include: Chest x-ray -does not show pneumonia Blood cell counts - were normal Electrolytes and kidney function - were normal D-dimer -screening test for blood clot that was mildly elevated as we discussed EKG Vital signs. See below for your results today.   Medications prescribed:  Tessalon Perles - cough suppressant medication  Naproxen - anti-inflammatory pain medication Do not exceed 500mg  naproxen every 12 hours, take with food  You have been prescribed an anti-inflammatory medication or NSAID. Take with food. Take smallest effective dose for the shortest duration needed for your pain. Stop taking if you experience stomach pain or vomiting.   Take any prescribed medications only as directed.  Home care instructions:  Follow any educational materials contained in this packet.  BE VERY CAREFUL not to take multiple medicines containing Tylenol (also called acetaminophen). Doing so can lead to an overdose which can damage your liver and cause liver failure and possibly death.   Follow-up instructions: Please follow-up with your primary care provider in the next 3 days for further evaluation of your symptoms.   Return instructions:  Please return to the Emergency Department if you experience worsening symptoms.  Return to the emergency department for worsening chest pain, difficulty breathing or trouble breathing, if you notice color change of your skin or feel lightheaded or pass out Please return if you have any other emergent concerns.  Additional Information:  Your vital signs today were: BP 123/66   Pulse 84   Temp 99.8 F (37.7 C) (Oral)   Resp 15   SpO2 98%  If your blood pressure (BP) was elevated above 135/85 this visit, please have this repeated by your doctor within one month. --------------

## 2019-11-18 NOTE — ED Triage Notes (Signed)
Per EMS-states she tested positive 1 week ago-states increased SOB, vomiting and diarrhea, fever, chills-mid sternal CP

## 2019-11-18 NOTE — ED Provider Notes (Signed)
[facility] DEPT Provider Note   CSN: [id number] Arrival date & time: 11/18/19  1430     History Chief Complaint  Patient presents with  . Covid Positive    Lindsay Medina is a 29 y.o. female.  Patient presents to the emergency department for evaluation of body aches, chest pain, nausea, vomiting, diarrhea in setting of COVID diagnosis approximately 1 week ago. Patient reports that over the past day her symptoms have worsened with increasing pain in the mid chest. She also reports generalized pain all over her body. She has developed vomiting that is worse after coughing. She also has nonbloody diarrhea. No urinary symptoms. No lower extremity swelling. No history of blood clots. She has taken over-the-counter medication at home without improvement. She denies shortness of breath or trouble breathing.   Patient states that she tested positive at a booth that was set up at her son's football game.  She states that she has 6 children at home, 3 of whom have tested positive.  Her husband was negative.        Past Medical History:  Diagnosis Date  . Hepatic hemangioma   . Medical history non-contributory   . Trichomonas contact, treated     Patient Active Problem List   Diagnosis Date Noted  . Post-dates pregnancy 09/27/2015    Past Surgical History:  Procedure Laterality Date  . CESAREAN SECTION N/A 09/28/2015   Procedure: CESAREAN SECTION;  Surgeon: Aletha Halim, MD;  Location: [facility];  Service: Obstetrics;  Laterality: N/A;  . NO PAST SURGERIES    . TUBAL LIGATION Bilateral 09/28/2015   Procedure: BILATERAL TUBAL LIGATION;  Surgeon: Aletha Halim, MD;  Location: [facility];  Service: Obstetrics;  Laterality: Bilateral;     OB History    Gravida  2   Para  2   Term  2   Preterm      AB      Living  1     SAB      TAB      Ectopic      Multiple  0   Live Births              Family  History  Problem Relation Age of Onset  . Cancer Maternal Grandmother   . Cancer Maternal Grandfather     Social History   Tobacco Use  . Smoking status: Current Every Day Smoker    Packs/day: 0.20    Types: Cigarettes  . Smokeless tobacco: Never Used  Substance Use Topics  . Alcohol use: Yes    Comment: weekly  . Drug use: Yes    Types: Marijuana    Home Medications Prior to Admission medications   Medication Sig Start Date End Date Taking? Authorizing Provider  ibuprofen (ADVIL,MOTRIN) 600 MG tablet Take 1 tablet (600 mg total) by mouth every 6 (six) hours. 09/30/15   Serita Grammes D, CNM  oxyCODONE (OXY IR/ROXICODONE) 5 MG immediate release tablet Take 1 tablet (5 mg total) by mouth every 4 (four) hours as needed (pain scale 4-7). 09/30/15   Myrtis Ser, CNM  prednisoLONE acetate (PRED FORTE) 1 % ophthalmic suspension Place 1 drop into the right eye 4 (four) times daily. 11/22/17   Wieters, Hallie C, PA-C  Prenatal Vit-Fe Fumarate-FA (PRENATAL MULTIVITAMIN) TABS tablet Take 1 tablet by mouth daily.     [provider]  tobramycin (TOBREX) 0.3 % ophthalmic solution Place 2 drops into the right eye every  4 (four) hours. 11/22/17   Wieters, Hallie C, PA-C    Allergies    Peanuts [peanut oil]  Review of Systems   Review of Systems  Constitutional: Positive for fatigue and fever.  HENT: Negative for rhinorrhea and sore throat.   Eyes: Negative for redness.  Respiratory: Positive for cough. Negative for shortness of breath.   Cardiovascular: Positive for chest pain.  Gastrointestinal: Positive for diarrhea, nausea and vomiting. Negative for abdominal pain.  Genitourinary: Negative for dysuria, frequency, hematuria and urgency.  Musculoskeletal: Positive for myalgias.  Skin: Negative for rash.  Neurological: Negative for headaches.    Physical Exam Updated Vital Signs BP 111/75 (BP Location: Right Arm)   Pulse (!) 105   Temp 99.8 F (37.7 C) (Oral)   Resp  20   SpO2 98%   Physical Exam Vitals and nursing note reviewed.  Constitutional:      General: She is not in acute distress.    Appearance: She is well-developed.  HENT:     Head: Normocephalic and atraumatic.     Right Ear: External ear normal.     Left Ear: External ear normal.     Nose: Nose normal.  Eyes:     Conjunctiva/sclera: Conjunctivae normal.  Cardiovascular:     Rate and Rhythm: Normal rate and regular rhythm.     Heart sounds: No murmur heard.      Comments: HR 90's Pulmonary:     Effort: No respiratory distress.     Breath sounds: No wheezing, rhonchi or rales.  Chest:     Chest wall: Tenderness present.    Abdominal:     Palpations: Abdomen is soft.     Tenderness: There is no abdominal tenderness. There is no guarding or rebound.  Musculoskeletal:     Cervical back: Normal range of motion and neck supple.     Right lower leg: No edema.     Left lower leg: No edema.  Skin:    General: Skin is warm and dry.     Findings: No rash.  Neurological:     General: No focal deficit present.     Mental Status: She is alert. Mental status is at baseline.     Motor: No weakness.  Psychiatric:        Mood and Affect: Mood normal.     ED Results / Procedures / Treatments   Labs (all labs ordered are listed, but only abnormal results are displayed) Labs Reviewed  COMPREHENSIVE METABOLIC PANEL - Abnormal; Notable for the following components:      Result Value   Potassium 3.3 (*)    CO2 21 (*)    Calcium 8.7 (*)    All other components within normal limits  D-DIMER, QUANTITATIVE (NOT AT [facility]) - Abnormal; Notable for the following components:   D-Dimer, Quant 0.70 (*)    All other components within normal limits  LIPASE, BLOOD  CBC  URINALYSIS, ROUTINE W REFLEX MICROSCOPIC  I-STAT BETA HCG BLOOD, ED ([facility], [facility], [facility] ONLY)    EKG None  Radiology DG Chest Port 1 View  Result Date: 11/18/2019 CLINICAL DATA:  Shortness of breath, vomiting, diarrhea,  fevers, chills. Midsternal chest pain. EXAM: PORTABLE CHEST 1 VIEW COMPARISON:  Chest radiographs May 24 2015. FINDINGS: The heart size and mediastinal contours are within normal limits. Both lungs are clear. No visible pleural effusions or pneumothorax. The visualized skeletal structures are unremarkable. IMPRESSION: No acute cardiopulmonary disease. Electronically Signed   By: Margaretha Sheffield  MD   On: 11/18/2019 16:33    Procedures Procedures (including critical care time)  Medications Ordered in ED Medications  sodium chloride 0.9 % bolus 1,000 mL (1,000 mLs Intravenous New Bag/Given 11/18/19 1646)  ondansetron (ZOFRAN) injection 4 mg (4 mg Intravenous Given 11/18/19 1646)  fentaNYL (SUBLIMAZE) injection 50 mcg (50 mcg Intravenous Given 11/18/19 1646)    ED Course  I have reviewed the triage vital signs and the nursing notes.  Pertinent labs & imaging results that were available during my care of the patient were reviewed by me and considered in my medical decision making (see chart for details).  Patient seen and examined. Work-up initiated. Medications/fluids ordered.   Vital signs reviewed and are as follows: BP 111/75 (BP Location: Right Arm)   Pulse (!) 105   Temp 99.8 F (37.7 C) (Oral)   Resp 20   SpO2 98%   6:09 PM patient states that she is feeling a lot better after treatment.  Her chest pain is much better now.  She is drinking in the room.  Patient's D-dimer was mildly elevated at 0.70.  I discussed this result with her.  We discussed the risks and benefits of CT imaging to evaluate for blood clot.  We discussed that most patients who have Covid do tend to have an elevation in D-dimer.  We discussed that her test is mildly elevated and not extremely elevated.  Discussed that her vital signs have been reassuring with heart rate now into the 80s and normal oxygen saturation.  We discussed that the only way to ensure no blood clot is with CT imaging.  I told her that I  feel overall she is low risk for blood clot but not zero risk.  We discussed radiation exposure with CT imaging.  After shared decision-making conversation with patient, she declines CT imaging at this time.  I did discuss that if she changes her mind or if she develops worsening chest pain, trouble breathing that she should return the emergency department for further evaluation.  6:30 PM plan for discharge home.  I have contacted monoclonal antibody clinic and they will call patient tomorrow.    MDM Rules/Calculators/A&P                          Patient here with chest pain and body aches, nausea, vomiting, and diarrhea, in setting of recent Covid diagnosis.  EKG today without ischemic findings.  Chest x-ray was clear without signs of pneumonia.  D-dimer was mildly elevated at 0.70.  Tachycardic on arrival, however during my exams, heart rate was 80s to 90s.  No signs of DVT clinically.  Patient does have chest wall pain with reproducible tenderness over the upper sternum.  Discussion had regarding CT imaging of the chest in setting of elevated D-dimer with patient as above.  She declines imaging at this time.  She is looking a lot better and is very comfortable.  Overall I have low suspicion for PE.  Reproducible chest pain is likely more related to the patient's coughing and muscle tenderness, potentially costochondritis or pleurisy.  Less suspicious for PE, pericarditis, pneumonia.  Patient is comfortable with this plan and we discussed at length, strict return instructions if her symptoms worsen, noting that breathing symptoms tend to get worse if PNA develops after about 1 week.  She seems reliable to return if worsening.     Final Clinical Impression(s) / ED Diagnoses Final diagnoses:  [id number]  Chest pain, unspecified type    Rx / DC Orders ED Discharge Orders         Ordered    naproxen (NAPROSYN) 500 MG tablet  2 times daily        11/18/19 1826    benzonatate (TESSALON) 100 MG  capsule  Every 8 hours        11/18/19 1826           Carlisle Cater, PA-C 11/18/19 1840    Gareth Morgan, MD 11/19/19 [phone]

## 2019-11-19 ENCOUNTER — Encounter: Payer: Self-pay | Admitting: Physician Assistant

## 2019-11-19 ENCOUNTER — Telehealth: Payer: Self-pay | Admitting: Physician Assistant

## 2019-11-19 NOTE — Telephone Encounter (Signed)
Called to discuss with patient about Covid symptoms and the use of bamlanivimab/etesevimab or casirivimab/imdevimab, a monoclonal antibody infusion for those with mild to moderate Covid symptoms and at a high risk of hospitalization.  Pt is qualified for this infusion at the [facility] due to; Specific high risk criteria : BMI > 25 and Other high risk medical condition per CDC:  high social vulnerability index   Message left to call back our hotline [phone].  Angelena Form PA-C  [facility]

## 2021-03-06 ENCOUNTER — Emergency Department (HOSPITAL_COMMUNITY): Payer: Medicaid Other

## 2021-03-06 ENCOUNTER — Encounter (HOSPITAL_COMMUNITY): Payer: Self-pay | Admitting: Emergency Medicine

## 2021-03-06 ENCOUNTER — Other Ambulatory Visit: Payer: Self-pay

## 2021-03-06 ENCOUNTER — Emergency Department (HOSPITAL_COMMUNITY)
Admission: EM | Admit: 2021-03-06 | Discharge: 2021-03-06 | Disposition: A | Discharge status: Home / Self Care | Payer: Medicaid Other | Attending: Emergency Medicine | Admitting: Emergency Medicine

## 2021-03-06 DIAGNOSIS — Z91010 Allergy to peanuts: Secondary | ICD-10-CM | POA: Insufficient documentation

## 2021-03-06 DIAGNOSIS — W1839XA Other fall on same level, initial encounter: Secondary | ICD-10-CM | POA: Diagnosis not present

## 2021-03-06 DIAGNOSIS — Z23 Encounter for immunization: Secondary | ICD-10-CM | POA: Diagnosis not present

## 2021-03-06 DIAGNOSIS — T1490XA Injury, unspecified, initial encounter: Secondary | ICD-10-CM

## 2021-03-06 DIAGNOSIS — S53105A Unspecified dislocation of left ulnohumeral joint, initial encounter: Secondary | ICD-10-CM

## 2021-03-06 DIAGNOSIS — W19XXXA Unspecified fall, initial encounter: Secondary | ICD-10-CM

## 2021-03-06 DIAGNOSIS — S59902A Unspecified injury of left elbow, initial encounter: Secondary | ICD-10-CM | POA: Diagnosis present

## 2021-03-06 DIAGNOSIS — M25562 Pain in left knee: Secondary | ICD-10-CM | POA: Insufficient documentation

## 2021-03-06 DIAGNOSIS — S53005A Unspecified dislocation of left radial head, initial encounter: Secondary | ICD-10-CM | POA: Diagnosis not present

## 2021-03-06 LAB — I-STAT BETA HCG BLOOD, ED (MC, WL, AP ONLY): I-stat hCG, quantitative: <5 m[IU]/mL (ref <5)

## 2021-03-06 MED ORDER — IBUPROFEN 600 MG PO TABS: 600.0000 mg | ORAL_TABLET | Freq: Four times a day (QID) | ORAL | 0 refills | Status: DC | PRN

## 2021-03-06 MED ORDER — ONDANSETRON HCL 4 MG/2ML IJ SOLN
INTRAMUSCULAR | Status: AC
  Administered 2021-03-06: 4 mg
  Filled 2021-03-06: qty 2

## 2021-03-06 MED ORDER — HYDROCODONE-ACETAMINOPHEN 5-325 MG PO TABS: 1.0000 | ORAL_TABLET | Freq: Four times a day (QID) | ORAL | 0 refills | Status: DC | PRN

## 2021-03-06 MED ORDER — FENTANYL CITRATE PF 50 MCG/ML IJ SOSY
100.0000 ug | PREFILLED_SYRINGE | Freq: Once | INTRAMUSCULAR | Status: AC
  Administered 2021-03-06: 100 ug via INTRAVENOUS
  Filled 2021-03-06: qty 2

## 2021-03-06 MED ORDER — PROPOFOL 10 MG/ML IV BOLUS
2.0000 mg/kg | Freq: Once | INTRAVENOUS | Status: AC
Start: 2021-03-06 — End: 2021-03-06
  Administered 2021-03-06: 83 mg via INTRAVENOUS
  Filled 2021-03-06: qty 20

## 2021-03-06 MED ORDER — FENTANYL CITRATE PF 50 MCG/ML IJ SOSY
100.0000 ug | PREFILLED_SYRINGE | Freq: Once | INTRAMUSCULAR | Status: AC
Start: 2021-03-06 — End: 2021-03-06
  Administered 2021-03-06: 100 ug via INTRAVENOUS
  Filled 2021-03-06: qty 2

## 2021-03-06 MED ORDER — TETANUS-DIPHTH-ACELL PERTUSSIS 5-2.5-18.5 LF-MCG/0.5 IM SUSY
0.5000 mL | PREFILLED_SYRINGE | Freq: Once | INTRAMUSCULAR | Status: AC
  Administered 2021-03-06: 0.5 mL via INTRAMUSCULAR
  Filled 2021-03-06: qty 0.5

## 2021-03-06 MED ORDER — MORPHINE SULFATE (PF) 4 MG/ML IV SOLN
4.0000 mg | Freq: Once | INTRAVENOUS | Status: AC
  Administered 2021-03-06: 4 mg via INTRAVENOUS
  Filled 2021-03-06: qty 1

## 2021-03-06 MED ORDER — PROPOFOL 10 MG/ML IV BOLUS: 2.0000 mg/kg | Freq: Once | INTRAVENOUS | Status: DC

## 2021-03-06 NOTE — ED Notes (Signed)
Consent placed in medical records

## 2021-03-06 NOTE — ED Provider Notes (Signed)
[facility] EMERGENCY DEPARTMENT Provider Note   CSN: [id number] Arrival date & time: 03/06/21  0032     History  Chief Complaint  Patient presents with   Arm Injury    Lindsay Medina is a 31 y.o. female.  HPI  30 year old female presents today for evaluation of a mechanical fall.  States she was trying to help one of her friends into the car when friend lost her balance fell onto her and then they both fell to the ground.  She is complaining of pain to the left upper extremity.  She has tingling to the distal left upper extremity as well.  She denies any head trauma or LOC.  Is also complaining of pain to the left knee.  She is not sure when her last tetanus shot was.  Pain is constant and severe in nature and worse with movement and palpation.  Home Medications Prior to Admission medications   Medication Sig Start Date End Date Taking? Authorizing Provider  HYDROcodone-acetaminophen (NORCO/VICODIN) 5-325 MG tablet Take 1 tablet by mouth every 6 (six) hours as needed. 03/06/21  Yes Henrine Hayter S, PA-C  ibuprofen (ADVIL) 600 MG tablet Take 1 tablet (600 mg total) by mouth every 6 (six) hours as needed. 03/06/21  Yes Milani Lowenstein S, PA-C  benzonatate (TESSALON) 100 MG capsule Take 1 capsule (100 mg total) by mouth every 8 (eight) hours. Patient not taking: Reported on 03/06/2021 11/18/19   Carlisle Cater, PA-C  naproxen (NAPROSYN) 500 MG tablet Take 1 tablet (500 mg total) by mouth 2 (two) times daily. Patient not taking: Reported on 03/06/2021 11/18/19   Carlisle Cater, PA-C      Allergies    Peanuts [peanut oil]    Review of Systems   Review of Systems See HPI for pertinent positives or negatives.   Physical Exam Updated Vital Signs BP (!) 133/97    Pulse 90    Temp 97.6 F (36.4 C) (Oral)    Resp 15    Wt 83.9 kg    LMP 02/06/2021 (Approximate)    SpO2 100%    BMI 33.84 kg/m  Physical Exam Vitals and nursing note reviewed.  Constitutional:       General: She is not in acute distress.    Appearance: She is well-developed.  HENT:     Head: Normocephalic and atraumatic.  Eyes:     Conjunctiva/sclera: Conjunctivae normal.  Cardiovascular:     Rate and Rhythm: Normal rate.     Pulses:          Radial pulses are 2+ on the left side.  Pulmonary:     Effort: Pulmonary effort is normal.  Musculoskeletal:        General: Normal range of motion.     Cervical back: Neck supple.     Comments: TTP to the proximal humerus and to the left elbow with obvious deformity. Radial pulse intact. Brisk cap refill to all fingers noted before and after procedure and splinting. Sensation to all fingers intact and symmetric to the BUE before and after reduction/splinting  Skin:    General: Skin is warm and dry.  Neurological:     Mental Status: She is alert.     Comments: Clear speech, no facial droop    ED Results / Procedures / Treatments   Labs (all labs ordered are listed, but only abnormal results are displayed) Labs Reviewed  I-STAT BETA HCG BLOOD, ED ([facility], [facility], [facility] ONLY)    EKG  None  Radiology DG Elbow 2 Views Left  Result Date: 03/06/2021 CLINICAL DATA:  Postreduction, dislocation of left elbow. EXAM: LEFT ELBOW - 2 VIEW COMPARISON:  None. FINDINGS: No acute fracture or dislocation. There has been successful reduction of left elbow dislocation. A joint effusion is noted at the elbow. The soft tissues are otherwise within normal limits. IMPRESSION: Successful reduction of left elbow dislocation. Small joint effusion at the left elbow. Electronically Signed   By: Brett Fairy M.D.   On: 03/06/2021 04:05   DG Elbow Complete Left  Result Date: 03/06/2021 CLINICAL DATA:  Fall, left arm deformity. EXAM: LEFT ELBOW - COMPLETE 3+ VIEW COMPARISON:  None. FINDINGS: No definite evidence of fracture. There is posterior and inferior dislocation of the proximal radius and ulna relative to the distal humerus. IMPRESSION: Posteroinferior dislocation of the  proximal ulna and radius relative to the distal humerus. No definite fracture. Electronically Signed   By: Brett Fairy M.D.   On: 03/06/2021 01:33   DG Shoulder Left  Result Date: 03/06/2021 CLINICAL DATA:  Fall, left arm deformity. EXAM: LEFT SHOULDER - 2+ VIEW COMPARISON:  None. FINDINGS: There is no evidence of fracture or dislocation. Mild degenerative changes are present at the acromioclavicular joint. Soft tissues are unremarkable. IMPRESSION: No acute fracture or dislocation. Electronically Signed   By: Brett Fairy M.D.   On: 03/06/2021 01:31   DG Knee Complete 4 Views Left  Result Date: 03/06/2021 CLINICAL DATA:  Fall, left knee pain. EXAM: LEFT KNEE - COMPLETE 4+ VIEW COMPARISON:  None. FINDINGS: No evidence of fracture, dislocation, or joint effusion. No evidence of arthropathy or other focal bone abnormality. Soft tissues are unremarkable. IMPRESSION: Negative. Electronically Signed   By: Brett Fairy M.D.   On: 03/06/2021 01:34    Procedures Reduction of dislocation  Date/Time: 03/06/2021 4:45 AM Performed by: Rodney Booze, PA-C Authorized by: Rodney Booze, PA-C  Consent: Verbal consent obtained. Risks and benefits: risks, benefits and alternatives were discussed Consent given by: patient Patient understanding: patient states understanding of the procedure being performed Patient consent: the patient's understanding of the procedure matches consent given Procedure consent: procedure consent matches procedure scheduled Relevant documents: relevant documents present and verified Test results: test results available and properly labeled Site marked: the operative site was marked Imaging studies: imaging studies available Required items: required blood products, implants, devices, and special equipment available Patient identity confirmed: verbally with patient Time out: Immediately prior to procedure a "time out" was called to verify the correct patient, procedure,  equipment, support staff and site/side marked as required. Preparation: Patient was prepped and draped in the usual sterile fashion. Local anesthesia used: no  Anesthesia: Local anesthesia used: no  Sedation: Patient sedated: yes Sedation type: moderate (conscious) sedation(see Dr. Weston Brass note)  Patient tolerance: patient tolerated the procedure well with no immediate complications       SPLINT APPLICATION Date/Time: 3:55 AM Authorized by: Rodney Booze Consent: Verbal consent obtained. Risks and benefits: risks, benefits and alternatives were discussed Consent given by: patient Splint applied by: orthopedic technician Location details: LUE Splint type: posterior long arm splint Supplies used: orthoglass and ace wrap Post-procedure: The splinted body part was neurovascularly unchanged following the procedure. Patient tolerance: Patient tolerated the procedure well with no immediate complications.    Medications Ordered in ED Medications  fentaNYL (SUBLIMAZE) injection 100 mcg (100 mcg Intravenous Given 03/06/21 0058)  Tdap (BOOSTRIX) injection 0.5 mL (0.5 mLs Intramuscular Given 03/06/21 0157)  fentaNYL (SUBLIMAZE)  injection 100 mcg (100 mcg Intravenous Given 03/06/21 0158)  fentaNYL (SUBLIMAZE) injection 100 mcg (100 mcg Intravenous Given 03/06/21 0217)  ondansetron (ZOFRAN) 4 MG/2ML injection (4 mg  Given 03/06/21 0303)  propofol (DIPRIVAN) 10 mg/mL bolus/IV push 167.8 mg (83 mg Intravenous Given 03/06/21 0340)  morphine (PF) 4 MG/ML injection 4 mg (4 mg Intravenous Given 03/06/21 9604)    ED Course/ Medical Decision Making/ A&P Clinical Course as of 03/06/21 5409  [facility] Mar 06, 2021  0253 Evaluated and consented for sedation and left elbow reduction [JM]    Clinical Course User Index [JM] Mesner, Corene Cornea, MD                           Medical Decision Making Amount and/or Complexity of Data Reviewed Radiology: ordered.  Risk Prescription drug management.   This  patient presents to the ED for concern of fall, this involves an extensive number of treatment options, and is a complaint that carries with it a high risk of complications and morbidity.  The differential diagnosis includes traumatic head injury, limb threatening emergency    Additional history obtained: Records reviewed Care Everywhere/External Records  Lab Tests: I Ordered, and personally interpreted labs.  The pertinent results include:  beta hcg negative  Imaging Studies ordered: I ordered imaging studies including X-ray left shoulder, left elbow, left knee   I independently visualized and interpreted imaging which showed  Xray left shoulder - negative Xray left knee -negative Xray left elbow - Posteroinferior dislocation of the proximal ulna and radius relative to the distal humerus. No definite fracture Post reduction xray left elbow - Successful reduction of left elbow dislocation. Small joint effusion at the left elbow I agree with the radiologist interpretation  Cardiac Monitoring: The patient was maintained on a cardiac monitor.  I personally viewed and interpreted the cardiac monitor which showed an underlying rhythm of:  sinus rhythm  Medicines ordered and prescription drug management: I ordered medication including fentanyl  for pain, tdap given her open wound  Reevaluation of the patient after these medicines showed that the patient    improved    Reevaluation: After the interventions noted above, I reevaluated the patient and found that they have :improved  Complexity of problems addressed: Patients presentation is most consistent with  acute presentation with potential threat to life or bodily function and acute complicated illness/injury requiring diagnostic workup  Disposition: After consideration of the diagnostic results and the patients response to treatment,  I feel that the patent would benefit from discharge with orthopedic follow-up.  She presented today  after a mechanical fall with left arm pain and swelling.  She was found to have an elbow dislocation.  She was consciously sedated and closed reduction of the left elbow with performed which was successful.  Patient was placed in a posterior long-arm splint and sling.  She remained neurovascularly intact following the procedure and we will discharge her with outpatient follow-up with orthopedics.  While in the department her Tdap was also updated given the abrasion to her left knee.  This is not a repairable wound.  She understands the plan for follow-up and understands return precautions.  All questions were answered.  Patient stable for discharge. .    Final Clinical Impression(s) / ED Diagnoses Final diagnoses:  Fall, initial encounter  Dislocation of left elbow, initial encounter    Rx / DC Orders ED Discharge Orders  Ordered    ibuprofen (ADVIL) 600 MG tablet  Every 6 hours PRN        03/06/21 0440    HYDROcodone-acetaminophen (NORCO/VICODIN) 5-325 MG tablet  Every 6 hours PRN        03/06/21 0440              Rodney Booze, PA-C 03/06/21 0446    Mesner, Corene Cornea, MD 03/06/21 [phone]

## 2021-03-06 NOTE — Discharge Instructions (Addendum)
Take 600 mg of ibuprofen every 6 hours as needed for pain  If you have breakthrough pain, Prescription given for Vicodin. Take medication as directed and do not operate machinery, drive a car, or work while taking this medication as it can make you drowsy.   Referred to an orthopedic doctor.  You will need to call the office to schedule an appointment for follow-up.  Please return to the emergency department for any new or worsening symptoms anytime.

## 2021-03-06 NOTE — ED Triage Notes (Addendum)
Pt here for L arm deformity after falling. Pt was trying to put help her cousin and pt fell onto L side. Pt has abrasion to L shin. Pt is able to move fingers in L arm, sensation intact

## 2021-03-06 NOTE — Progress Notes (Signed)
Orthopedic Tech Progress Note Patient Details:  Lindsay Medina March 21, 1990 [id number]  Ortho Devices Type of Ortho Device: Post (long arm) splint Ortho Device/Splint Location: lue Ortho Device/Splint Interventions: Ordered, Application, Adjustment  I applied the splint as requested by the dr Post Interventions Patient Tolerated: Well Instructions Provided: Care of device, Adjustment of device  Karolee Stamps 03/06/2021, 5:11 AM

## 2021-03-06 NOTE — ED Provider Notes (Signed)
Patient had a fall prior to coming and suffering left elbow pain.  Also abrasion to her left knee.  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  No drugs.  Please see PA note for the entirety of the history.  Physical Exam  BP 104/74    Pulse 90    Temp 98 F (36.7 C) (Oral)    Resp 18    Wt 83.9 kg    LMP 02/06/2021 (Approximate)    SpO2 100%    BMI 33.84 kg/m   Physical Exam Vitals and nursing note reviewed.  Constitutional:      Appearance: She is well-developed.  HENT:     Head: Normocephalic and atraumatic.     Mouth/Throat:     Mouth: Mucous membranes are moist.     Pharynx: Oropharynx is clear.  Eyes:     Pupils: Pupils are equal, round, and reactive to light.  Cardiovascular:     Rate and Rhythm: Normal rate and regular rhythm.  Pulmonary:     Effort: No respiratory distress.     Breath sounds: No stridor.  Abdominal:     General: Abdomen is flat. There is no distension.  Musculoskeletal:        General: Swelling (L elbow) and deformity present.     Cervical back: Normal range of motion.  Skin:    General: Skin is warm.     Comments: Abrasion to left knee  Neurological:     General: No focal deficit present.     Mental Status: She is alert.    Procedures  .Sedation  Date/Time: 03/06/2021 6:58 AM Performed by: Merrily Pew, MD Authorized by: Merrily Pew, MD   Consent:    Consent obtained:  Verbal and written   Consent given by:  Patient   Risks discussed:  Vomiting, allergic reaction, dysrhythmia, inadequate sedation, prolonged hypoxia resulting in organ damage, respiratory compromise necessitating ventilatory assistance and intubation and prolonged sedation necessitating reversal   Alternatives discussed:  Analgesia without sedation, anxiolysis and regional anesthesia Universal protocol:    Procedure explained and questions answered to patient or proxy's satisfaction: yes     Relevant documents  present and verified: yes     Immediately prior to procedure, a time out was called: yes     Patient identity confirmed:  Anonymous protocol, patient vented/unresponsive, arm band, verbally with patient and provided demographic data Pre-sedation assessment:    Time since last food or drink:  4 hours   NPO status caution: urgency dictates proceeding with non-ideal NPO status     ASA classification: class 1 - normal, healthy patient     Mouth opening:  3 or more finger widths   Thyromental distance:  3 finger widths   Mallampati score:  III - soft palate, base of uvula visible   Neck mobility: normal     Pre-sedation assessments completed and reviewed: airway patency, cardiovascular function, hydration status, mental status, nausea/vomiting, pain level, respiratory function and temperature   Immediate pre-procedure details:    Reassessment: Patient reassessed immediately prior to procedure     Reviewed: vital signs, relevant labs/tests and NPO status     Verified: bag valve mask available, emergency equipment available, intubation equipment available, IV patency confirmed, oxygen available and suction available   Procedure details (see MAR for exact dosages):    Preoxygenation:  Nasal cannula   Sedation:  Propofol   Intended level of sedation: deep  Analgesia:  Fentanyl   Intra-procedure monitoring:  Blood pressure monitoring, cardiac monitor, continuous pulse oximetry, continuous capnometry, frequent vital sign checks and frequent LOC assessments   Total Provider sedation time (minutes):  12 Post-procedure details:    Attendance: Constant attendance by certified staff until patient recovered     Recovery: Patient returned to pre-procedure baseline     Post-sedation assessments completed and reviewed: airway patency, cardiovascular function, hydration status, mental status, nausea/vomiting, pain level, respiratory function and temperature     Patient is stable for discharge or admission:  yes     Procedure completion:  Tolerated well, no immediate complications  ED Course / MDM   Clinical Course as of 03/06/21 0656  Sun Mar 06, 2021  0253 Evaluated and consented for sedation and left elbow reduction [JM]    Clinical Course User Index [JM] Georgeann Brinkman, Corene Cornea, MD   Medical Decision Making Amount and/or Complexity of Data Reviewed Radiology: ordered.  Risk Prescription drug management.   Sedation without complication. Reduction without complication aside from swelling. Neuro intact afterwards. Posterior splint placed, plan for ortho follow up in 7-10 days.        Merrily Pew, MD 03/06/21 [phone]

## 2023-03-27 ENCOUNTER — Ambulatory Visit (INDEPENDENT_AMBULATORY_CARE_PROVIDER_SITE_OTHER): Payer: Medicaid Other | Admitting: Family

## 2023-03-27 ENCOUNTER — Other Ambulatory Visit (HOSPITAL_COMMUNITY)
Admission: RE | Admit: 2023-03-27 | Discharge: 2023-03-27 | Disposition: A | Discharge status: Home / Self Care | Payer: Medicaid Other | Source: Ambulatory Visit | Attending: Family | Admitting: Family

## 2023-03-27 VITALS — BP 107/76 | Temp 98.0°F | Ht 62.0 in | Wt 201.0 lb

## 2023-03-27 DIAGNOSIS — Z114 Encounter for screening for human immunodeficiency virus [HIV]: Secondary | ICD-10-CM

## 2023-03-27 DIAGNOSIS — Z113 Encounter for screening for infections with a predominantly sexual mode of transmission: Secondary | ICD-10-CM | POA: Diagnosis present

## 2023-03-27 DIAGNOSIS — Z6836 Body mass index (BMI) 36.0-36.9, adult: Secondary | ICD-10-CM

## 2023-03-27 DIAGNOSIS — R635 Abnormal weight gain: Secondary | ICD-10-CM | POA: Diagnosis not present

## 2023-03-27 DIAGNOSIS — Z713 Dietary counseling and surveillance: Secondary | ICD-10-CM | POA: Diagnosis not present

## 2023-03-27 DIAGNOSIS — Z7689 Persons encountering health services in other specified circumstances: Secondary | ICD-10-CM

## 2023-03-27 DIAGNOSIS — K219 Gastro-esophageal reflux disease without esophagitis: Secondary | ICD-10-CM | POA: Diagnosis not present

## 2023-03-27 MED ORDER — PHENTERMINE HCL 15 MG PO CAPS: 15.0000 mg | ORAL_CAPSULE | ORAL | 0 refills | Status: AC

## 2023-03-27 MED ORDER — OMEPRAZOLE 20 MG PO CPDR: 20.0000 mg | DELAYED_RELEASE_CAPSULE | Freq: Every day | ORAL | 0 refills | Status: AC

## 2023-03-27 NOTE — Progress Notes (Signed)
 Subjective:    Lindsay Medina - 33 y.o. female MRN [id number]  Date of birth: Aug 04, 1990  HPI  Lindsay Medina is to establish care.   Current issues and/or concerns: - States acid reflux no matter what she eats or drinks. Denies red flag symptoms. Taking over-the-counter antacids with no relief. - Reports she would like to lose weight and thinks bilateral knee pain related to weight gain. She denies recent trauma/injury and red flag symptoms. Reports she would like to get back to around 170 pounds. She does watch what she eats. She does not exercise outside of normal routine. - Routine STD screening. Denies symptoms/recent exposure. - No further issues/concerns for discussion today.  ROS per HPI     Health Maintenance:  Health Maintenance Due  Topic Date Due   Hepatitis C Screening  Never done   Cervical Cancer Screening (HPV/Pap Cotest)  Never done   COVID-19 Vaccine (2 - 2024-25 season) 10/01/2022     Past Medical History: Patient Active Problem List   Diagnosis Date Noted   Post-dates pregnancy 09/27/2015      Social History   reports that she has been smoking cigarettes. She has never used smokeless tobacco. She reports current alcohol use. She reports current drug use. Drug: Marijuana.   Family History  family history includes Cancer in her maternal grandfather and maternal grandmother.   Medications: reviewed and updated   Objective:   Physical Exam BP 107/76   Temp 98 F (36.7 C) (Oral)   Ht 5\' 2"  (1.575 m)   Wt 201 lb (91.2 kg)   SpO2 96%   BMI 36.76 kg/m   Physical Exam HENT:     Head: Normocephalic and atraumatic.     Nose: Nose normal.     Mouth/Throat:     Mouth: Mucous membranes are moist.     Pharynx: Oropharynx is clear.  Eyes:     Extraocular Movements: Extraocular movements intact.     Conjunctiva/sclera: Conjunctivae normal.     Pupils: Pupils are equal, round, and reactive to light.  Cardiovascular:     Rate and Rhythm:  Normal rate and regular rhythm.     Pulses: Normal pulses.     Heart sounds: Normal heart sounds.  Pulmonary:     Effort: Pulmonary effort is normal.     Breath sounds: Normal breath sounds.  Musculoskeletal:        General: Normal range of motion.     Cervical back: Normal range of motion and neck supple.  Neurological:     General: No focal deficit present.     Mental Status: She is alert and oriented to person, place, and time.  Psychiatric:        Mood and Affect: Mood normal.        Behavior: Behavior normal.        Assessment & Plan:  1. Encounter to establish care (Primary) - Patient presents today to establish care. During the interim follow-up with primary provider as scheduled.  - Return for annual physical examination, labs, and health maintenance. Arrive fasting meaning having no food for at least 8 hours prior to appointment. You may have only water or black coffee. Please take scheduled medications as normal.  2. Gastroesophageal reflux disease, unspecified whether esophagitis present - Omeprazole as prescribed. Counseled on medication adherence/adverse effects.  - Referral to Gastroenterology for evaluation/management. - Follow-up with primary provider as scheduled. - omeprazole (PRILOSEC) 20 MG capsule; Take 1 capsule (20 mg total) by  mouth daily.  Dispense: 90 capsule; Refill: 0 - Ambulatory referral to Gastroenterology  3. Encounter for weight management 4. BMI 36.0-36.9,adult 5. Weight gain - Phentermine as prescribed. Counseled on medication adherence and adverse effects.  - I did check the [facility] prescription drug database. - Counseled on low-sodium DASH diet and 150 minutes of moderate intensity exercise per week as tolerated to assist with weight management.  - Referral to Medical Weight Management for evaluation/management. - Follow-up with primary provider as scheduled. - Amb Ref to Medical Weight Management - phentermine 15 MG capsule; Take 1  capsule (15 mg total) by mouth every morning.  Dispense: 30 capsule; Refill: 0  6. Routine screening for STI (sexually transmitted infection) - Routine screening.  - [facility] - Cervicovaginal ancillary only  7. Encounter for screening for HIV - Routine screening.  - HIV antibody (with reflex)   Patient was given clear instructions to go to Emergency Department or return to medical center if symptoms don't improve, worsen, or new problems develop.The patient verbalized understanding.  I discussed the assessment and treatment plan with the patient. The patient was provided an opportunity to ask questions and all were answered. The patient agreed with the plan and demonstrated an understanding of the instructions.   The patient was advised to call back or seek an in-person evaluation if the symptoms worsen or if the condition fails to improve as anticipated.    Ricky Stabs, NP 03/27/2023, 3:25 PM Primary Care at [facility]

## 2023-03-27 NOTE — Progress Notes (Signed)
 Patient states she thinks leg problems may be because her weight.   States constant heart burn.

## 2023-03-28 ENCOUNTER — Other Ambulatory Visit: Payer: Self-pay | Admitting: Family

## 2023-03-28 ENCOUNTER — Encounter: Payer: Self-pay | Admitting: Family

## 2023-03-28 DIAGNOSIS — B9689 Other specified bacterial agents as the cause of diseases classified elsewhere: Secondary | ICD-10-CM

## 2023-03-28 LAB — CERVICOVAGINAL ANCILLARY ONLY
Bacterial Vaginitis (gardnerella): POSITIVE — AB
Candida Glabrata: NEGATIVE
Candida Vaginitis: NEGATIVE
Chlamydia: NEGATIVE
Comment: NEGATIVE
Comment: NEGATIVE
Comment: NEGATIVE
Comment: NEGATIVE
Comment: NEGATIVE
Comment: NORMAL
Neisseria Gonorrhea: NEGATIVE
Trichomonas: NEGATIVE

## 2023-03-28 LAB — HIV ANTIBODY (ROUTINE TESTING W REFLEX): HIV Screen 4th Generation wRfx: NONREACTIVE

## 2023-03-28 LAB — RPR: RPR Ser Ql: NONREACTIVE

## 2023-03-28 MED ORDER — METRONIDAZOLE 500 MG PO TABS: 500.0000 mg | ORAL_TABLET | Freq: Two times a day (BID) | ORAL | 0 refills | Status: AC

## 2023-04-25 ENCOUNTER — Encounter: Payer: Medicaid Other | Admitting: Family

## 2023-04-25 NOTE — Progress Notes (Signed)
 Erroneous encounter-disregard

## 2023-05-02 ENCOUNTER — Encounter (INDEPENDENT_AMBULATORY_CARE_PROVIDER_SITE_OTHER): Payer: Self-pay | Admitting: Adult Health

## 2023-05-07 ENCOUNTER — Encounter (INDEPENDENT_AMBULATORY_CARE_PROVIDER_SITE_OTHER): Payer: Self-pay

## 2023-05-14 ENCOUNTER — Encounter (INDEPENDENT_AMBULATORY_CARE_PROVIDER_SITE_OTHER): Payer: Self-pay

## 2023-12-16 ENCOUNTER — Ambulatory Visit (HOSPITAL_COMMUNITY)
Admission: EM | Admit: 2023-12-16 | Discharge: 2023-12-16 | Disposition: A | Discharge status: Home / Self Care | Attending: Physician Assistant | Admitting: Physician Assistant

## 2023-12-16 ENCOUNTER — Encounter (HOSPITAL_COMMUNITY): Payer: Self-pay

## 2023-12-16 DIAGNOSIS — M940 Chondrocostal junction syndrome [Tietze]: Secondary | ICD-10-CM | POA: Diagnosis not present

## 2023-12-16 DIAGNOSIS — J069 Acute upper respiratory infection, unspecified: Secondary | ICD-10-CM

## 2023-12-16 DIAGNOSIS — R051 Acute cough: Secondary | ICD-10-CM

## 2023-12-16 LAB — POCT INFLUENZA A/B
Influenza A, POC: NEGATIVE
Influenza B, POC: NEGATIVE

## 2023-12-16 LAB — POC SOFIA SARS ANTIGEN FIA: SARS Coronavirus 2 Ag: NEGATIVE

## 2023-12-16 MED ORDER — PROMETHAZINE-DM 6.25-15 MG/5ML PO SYRP: 5.0000 mL | ORAL_SOLUTION | Freq: Two times a day (BID) | ORAL | 0 refills | Status: AC | PRN

## 2023-12-16 MED ORDER — IBUPROFEN 800 MG PO TABS
800.0000 mg | ORAL_TABLET | Freq: Once | ORAL | Status: AC
  Administered 2023-12-16: 800 mg via ORAL

## 2023-12-16 MED ORDER — IBUPROFEN 800 MG PO TABS
ORAL_TABLET | ORAL | Status: AC
  Filled 2023-12-16: qty 1

## 2023-12-16 MED ORDER — PREDNISONE 20 MG PO TABS: 40.0000 mg | ORAL_TABLET | Freq: Every day | ORAL | 0 refills | Status: AC

## 2023-12-16 NOTE — Discharge Instructions (Addendum)
 You tested negative for COVID and flu.  I believe that you have a different virus causing your symptoms.  I would like to start prednisone to help with your discomfort in your chest caused by inflammation in your rib cage.  Do not take NSAIDs with this medication including aspirin, ibuprofen /Advil , naproxen /Aleve .  You can use acetaminophen /Tylenol  as needed.  Use Promethazine  DM for cough.  This will make you sleepy so do not drive or drink alcohol with taking it.  If you are not feeling better in a week please return for reevaluation.  If anything worsens and you have high fever, worsening cough, shortness of breath, chest pain, nausea/vomiting interfering with oral intake you need to be seen immediately.

## 2023-12-16 NOTE — ED Triage Notes (Signed)
 Onset with low back pain. States now having SOB, sever cough, and body aches. All over but pain worse under the breast and into the back. Onset this past Friday. States her son is sick but started after her.   Patient tried otc pain and cold meds with no relief.

## 2023-12-16 NOTE — ED Provider Notes (Signed)
 [facility]    CSN: [id number] Arrival date & time: 12/16/23  9183      History   Chief Complaint Chief Complaint  Patient presents with   Cough   Generalized Body Aches    HPI Lindsay Medina is a 33 y.o. female.   Patient presents today with a 3-day history of URI symptoms.  She reports body aches including lower back pain and pain in her lower rib cage that is worse with coughing.  She denies any chest pain, shortness of breath, nausea, vomiting, diarrhea.  She has had a severe cough and believes that she had a fever within the past few days but did not record her temperature.  She reports associated sore throat and congestion.  Reports that her son was sick but he developed symptoms after her.  She has had COVID at the beginning of the pandemic.  She has not had it more recently.  She did have initial COVID vaccines but has not had recent booster.  She has been taking DayQuil and NyQuil without improvement of symptoms.  She has not had any medication today.  She denies any history of allergies, asthma, COPD, smoking.  She is confident that she is not pregnant.  Denies any recent antibiotics or steroids.    Past Medical History:  Diagnosis Date   Hepatic hemangioma    Medical history non-contributory    Morbid obesity (HCC)    Trichomonas contact, treated     Patient Active Problem List   Diagnosis Date Noted   Post-dates pregnancy 09/27/2015    Past Surgical History:  Procedure Laterality Date   CESAREAN SECTION N/A 09/28/2015   Procedure: CESAREAN SECTION;  Surgeon: Bebe Furry, MD;  Location: [facility];  Service: Obstetrics;  Laterality: N/A;   NO PAST SURGERIES     TUBAL LIGATION Bilateral 09/28/2015   Procedure: BILATERAL TUBAL LIGATION;  Surgeon: Bebe Furry, MD;  Location: [facility];  Service: Obstetrics;  Laterality: Bilateral;    OB History     Gravida  2   Para  2   Term  2   Preterm      AB      Living   1      SAB      IAB      Ectopic      Multiple  0   Live Births               Home Medications    Prior to Admission medications   Medication Sig Start Date End Date Taking? Authorizing Provider  predniSONE (DELTASONE) 20 MG tablet Take 2 tablets (40 mg total) by mouth daily for 5 days. 12/16/23 12/21/23 Yes Reannon Candella K, PA-C  promethazine -dextromethorphan (PROMETHAZINE -DM) 6.25-15 MG/5ML syrup Take 5 mLs by mouth 2 (two) times daily as needed for cough. 12/16/23  Yes Kyley Solow K, PA-C  omeprazole  (PRILOSEC) 20 MG capsule Take 1 capsule (20 mg total) by mouth daily. 03/27/23 06/25/23  Jaycee Greig PARAS, NP  phentermine  15 MG capsule Take 1 capsule (15 mg total) by mouth every morning. 03/27/23   Jaycee Greig PARAS, NP    Family History Family History  Problem Relation Age of Onset   Cancer Maternal Grandmother    Cancer Maternal Grandfather     Social History Social History   Tobacco Use   Smoking status: Every Day    Current packs/day: 0.20    Types: Cigarettes   Smokeless tobacco: Never  Substance  Use Topics   Alcohol use: Yes    Comment: weekly   Drug use: Yes    Types: Marijuana     Allergies   Peanuts [peanut oil]   Review of Systems Review of Systems  Constitutional:  Positive for activity change, fatigue and fever. Negative for appetite change.  HENT:  Positive for congestion and sore throat. Negative for sinus pressure and sneezing.   Respiratory:  Positive for cough. Negative for shortness of breath.   Cardiovascular:  Negative for chest pain.  Gastrointestinal:  Negative for diarrhea, nausea and vomiting.  Musculoskeletal:  Positive for arthralgias, back pain and myalgias.  Neurological:  Negative for dizziness, light-headedness and headaches.     Physical Exam Triage Vital Signs ED Triage Vitals  Encounter Vitals Group     BP 12/16/23 0851 100/70     Girls Systolic BP Percentile --      Girls Diastolic BP Percentile --      Boys  Systolic BP Percentile --      Boys Diastolic BP Percentile --      Pulse Rate 12/16/23 0851 (!) 103     Resp 12/16/23 0851 18     Temp 12/16/23 0851 98 F (36.7 C)     Temp Source 12/16/23 0851 Oral     SpO2 12/16/23 0851 97 %     Weight --      Height --      Head Circumference --      Peak Flow --      Pain Score 12/16/23 0850 10     Pain Loc --      Pain Education --      Exclude from Growth Chart --    No data found.  Updated Vital Signs BP 100/70 (BP Location: Left Arm)   Pulse (!) 103   Temp 98 F (36.7 C) (Oral)   Resp 18   LMP 11/14/2023 (Approximate)   SpO2 97%   Breastfeeding No   Visual Acuity Right Eye Distance:   Left Eye Distance:   Bilateral Distance:    Right Eye Near:   Left Eye Near:    Bilateral Near:     Physical Exam Vitals reviewed.  Constitutional:      General: She is awake. She is not in acute distress.    Appearance: Normal appearance. She is well-developed. She is not ill-appearing.     Comments: Very pleasant female appears stated age in no acute distress sitting comfortably in exam room  HENT:     Head: Normocephalic and atraumatic.     Right Ear: External ear normal. There is impacted cerumen.     Left Ear: Tympanic membrane, ear canal and external ear normal. Tympanic membrane is not erythematous or bulging.     Nose:     Right Sinus: No maxillary sinus tenderness or frontal sinus tenderness.     Left Sinus: No maxillary sinus tenderness or frontal sinus tenderness.     Mouth/Throat:     Pharynx: Uvula midline. Posterior oropharyngeal erythema present. No oropharyngeal exudate.  Cardiovascular:     Rate and Rhythm: Normal rate and regular rhythm.     Heart sounds: Normal heart sounds, S1 normal and S2 normal. No murmur heard. Pulmonary:     Effort: Pulmonary effort is normal.     Breath sounds: Normal breath sounds. No wheezing, rhonchi or rales.     Comments: Clear auscultation bilaterally Chest:     Chest wall: Tenderness  present. No deformity  or swelling.     Comments: Tenderness palpation over anterior chest wall; pain is reproducible on exam Psychiatric:        Behavior: Behavior is cooperative.      UC Treatments / Results  Labs (all labs ordered are listed, but only abnormal results are displayed) Labs Reviewed  POCT INFLUENZA A/B - Normal  POC SOFIA SARS ANTIGEN FIA    EKG   Radiology No results found.  Procedures Procedures (including critical care time)  Medications Ordered in UC Medications  ibuprofen  (ADVIL ) tablet 800 mg (800 mg Oral Given 12/16/23 0909)    Initial Impression / Assessment and Plan / UC Course  I have reviewed the triage vital signs and the nursing notes.  Pertinent labs & imaging results that were available during my care of the patient were reviewed by me and considered in my medical decision making (see chart for details).     Patient is well-appearing, afebrile, nontoxic.  No evidence of acute infection on physical exam that would initiation of antibiotics.  She tested negative for COVID and flu.  We discussed she likely has a different virus that is causing her symptoms and that this is generally self-limited in nature.  Chest x-ray was deferred as she had no adventitious lung sounds on exam and her oxygen saturation was 97%.  She was given ibuprofen  in clinic with minimal improvement of symptoms.  She did have reproducible chest discomfort with palpation and so we discussed that this is likely related to costochondritis and since she did not have significant improvement with ibuprofen  we will try a short course of prednisone to help her symptoms.  We discussed that she is not to combine this with NSAIDs due to risk of GI bleeding but can use Tylenol  as needed.  She was given Promethazine  DM for cough and we discussed that this can be sedating and she is not to drive or drink alcohol while taking it.  If she is not feeling better within a week she is to return for  reevaluation.  Discussed that if anything worsens she needs to be seen immediately.  Strict return precautions given.  Excuse note provided.  Final Clinical Impressions(s) / UC Diagnoses   Final diagnoses:  Acute cough  URI with cough and congestion  Costochondritis     Discharge Instructions      You tested negative for COVID and flu.  I believe that you have a different virus causing your symptoms.  I would like to start prednisone to help with your discomfort in your chest caused by inflammation in your rib cage.  Do not take NSAIDs with this medication including aspirin, ibuprofen /Advil , naproxen /Aleve .  You can use acetaminophen /Tylenol  as needed.  Use Promethazine  DM for cough.  This will make you sleepy so do not drive or drink alcohol with taking it.  If you are not feeling better in a week please return for reevaluation.  If anything worsens and you have high fever, worsening cough, shortness of breath, chest pain, nausea/vomiting interfering with oral intake you need to be seen immediately.     ED Prescriptions     Medication Sig Dispense Auth. Provider   predniSONE (DELTASONE) 20 MG tablet Take 2 tablets (40 mg total) by mouth daily for 5 days. 10 tablet Vartan Kerins K, PA-C   promethazine -dextromethorphan (PROMETHAZINE -DM) 6.25-15 MG/5ML syrup Take 5 mLs by mouth 2 (two) times daily as needed for cough. 118 mL Taya Ashbaugh K, PA-C      PDMP  not reviewed this encounter.   Sherrell Rocky POUR, PA-C 12/16/23 0945

## 2024-03-03 ENCOUNTER — Encounter
